# Patient Record
Sex: Female | Born: 1955 | Race: Black or African American | Hispanic: No | Marital: Married | Smoking: Former smoker
Health system: Southern US, Community
[De-identification: ages and names within clinical notes are randomized; demographics above are authoritative.]

## PROBLEM LIST (undated history)

## (undated) DIAGNOSIS — B977 Papillomavirus as the cause of diseases classified elsewhere: Secondary | ICD-10-CM

## (undated) DIAGNOSIS — F329 Major depressive disorder, single episode, unspecified: Secondary | ICD-10-CM

## (undated) DIAGNOSIS — M5136 Other intervertebral disc degeneration, lumbar region: Secondary | ICD-10-CM

## (undated) DIAGNOSIS — I10 Essential (primary) hypertension: Secondary | ICD-10-CM

## (undated) DIAGNOSIS — N952 Postmenopausal atrophic vaginitis: Secondary | ICD-10-CM

## (undated) DIAGNOSIS — M1990 Unspecified osteoarthritis, unspecified site: Secondary | ICD-10-CM

## (undated) DIAGNOSIS — F32A Depression, unspecified: Secondary | ICD-10-CM

## (undated) DIAGNOSIS — M51369 Other intervertebral disc degeneration, lumbar region without mention of lumbar back pain or lower extremity pain: Secondary | ICD-10-CM

## (undated) DIAGNOSIS — N63 Unspecified lump in unspecified breast: Secondary | ICD-10-CM

## (undated) DIAGNOSIS — K5790 Diverticulosis of intestine, part unspecified, without perforation or abscess without bleeding: Secondary | ICD-10-CM

## (undated) HISTORY — DX: Other intervertebral disc degeneration, lumbar region without mention of lumbar back pain or lower extremity pain: M51.369

## (undated) HISTORY — DX: Diverticulosis of intestine, part unspecified, without perforation or abscess without bleeding: K57.90

## (undated) HISTORY — DX: Papillomavirus as the cause of diseases classified elsewhere: B97.7

## (undated) HISTORY — DX: Postmenopausal atrophic vaginitis: N95.2

## (undated) HISTORY — DX: Major depressive disorder, single episode, unspecified: F32.9

## (undated) HISTORY — PX: OTHER SURGICAL HISTORY: SHX169

## (undated) HISTORY — DX: Unspecified osteoarthritis, unspecified site: M19.90

## (undated) HISTORY — DX: Unspecified lump in unspecified breast: N63.0

## (undated) HISTORY — DX: Other intervertebral disc degeneration, lumbar region: M51.36

## (undated) HISTORY — DX: Essential (primary) hypertension: I10

## (undated) HISTORY — DX: Depression, unspecified: F32.A

---

## 2000-08-22 ENCOUNTER — Encounter: Admission: RE | Admit: 2000-08-22 | Discharge: 2000-08-22 | Payer: Self-pay | Admitting: Internal Medicine

## 2006-06-21 ENCOUNTER — Ambulatory Visit: Payer: Self-pay | Admitting: Family Medicine

## 2006-09-05 ENCOUNTER — Ambulatory Visit: Payer: Self-pay | Admitting: Family Medicine

## 2006-09-05 ENCOUNTER — Other Ambulatory Visit: Admission: RE | Admit: 2006-09-05 | Discharge: 2006-09-05 | Payer: Self-pay | Admitting: Family Medicine

## 2006-09-05 ENCOUNTER — Encounter: Payer: Self-pay | Admitting: Family Medicine

## 2006-09-05 LAB — CONVERTED CEMR LAB
ALT: 15 units/L (ref 0–40)
AST: 20 units/L (ref 0–37)
Alkaline Phosphatase: 45 units/L (ref 39–117)
BUN: 11 mg/dL (ref 6–23)
Basophils Relative: 0.6 % (ref 0.0–1.0)
CO2: 27 meq/L (ref 19–32)
Calcium: 9.3 mg/dL (ref 8.4–10.5)
Creatinine, Ser: 0.8 mg/dL (ref 0.4–1.2)
Eosinophils Relative: 1.9 % (ref 0.0–5.0)
HDL: 57.1 mg/dL (ref >39.0)
Hemoglobin: 13 g/dL (ref 12.0–15.0)
LDL Cholesterol: 111 mg/dL — ABNORMAL HIGH (ref 0–99)
Monocytes Relative: 7.2 % (ref 3.0–11.0)
Platelets: 256 10*3/uL (ref 150–400)
RDW: 14.9 % — ABNORMAL HIGH (ref 11.5–14.6)
Total Bilirubin: 0.7 mg/dL (ref 0.3–1.2)
Total Protein: 7 g/dL (ref 6.0–8.3)
Triglycerides: 59 mg/dL (ref 0–149)
VLDL: 12 mg/dL (ref 0–40)
WBC: 3.4 10*3/uL — ABNORMAL LOW (ref 4.5–10.5)

## 2006-09-19 ENCOUNTER — Ambulatory Visit: Payer: Self-pay | Admitting: Gastroenterology

## 2006-09-19 LAB — CONVERTED CEMR LAB: Folate: 9 ng/mL

## 2006-10-23 ENCOUNTER — Ambulatory Visit: Payer: Self-pay | Admitting: Gastroenterology

## 2006-11-15 ENCOUNTER — Ambulatory Visit: Payer: Self-pay | Admitting: Family Medicine

## 2006-11-21 ENCOUNTER — Ambulatory Visit: Payer: Self-pay | Admitting: Gastroenterology

## 2007-02-19 ENCOUNTER — Encounter: Admission: RE | Admit: 2007-02-19 | Discharge: 2007-02-19 | Payer: Self-pay | Admitting: Family Medicine

## 2007-02-27 ENCOUNTER — Telehealth: Payer: Self-pay | Admitting: Family Medicine

## 2007-03-12 DIAGNOSIS — M542 Cervicalgia: Secondary | ICD-10-CM

## 2007-03-13 ENCOUNTER — Ambulatory Visit: Payer: Self-pay | Admitting: Family Medicine

## 2007-03-25 DIAGNOSIS — J309 Allergic rhinitis, unspecified: Secondary | ICD-10-CM | POA: Insufficient documentation

## 2007-07-16 ENCOUNTER — Telehealth: Payer: Self-pay | Admitting: *Deleted

## 2007-10-17 ENCOUNTER — Other Ambulatory Visit: Admission: RE | Admit: 2007-10-17 | Discharge: 2007-10-17 | Payer: Self-pay | Admitting: Family Medicine

## 2007-10-17 ENCOUNTER — Ambulatory Visit: Payer: Self-pay | Admitting: Family Medicine

## 2007-10-17 ENCOUNTER — Encounter: Payer: Self-pay | Admitting: Family Medicine

## 2007-10-17 DIAGNOSIS — N952 Postmenopausal atrophic vaginitis: Secondary | ICD-10-CM

## 2008-04-05 ENCOUNTER — Encounter: Admission: RE | Admit: 2008-04-05 | Discharge: 2008-04-05 | Payer: Self-pay | Admitting: Family Medicine

## 2008-04-06 ENCOUNTER — Telehealth: Payer: Self-pay | Admitting: Family Medicine

## 2008-04-06 ENCOUNTER — Ambulatory Visit: Payer: Self-pay | Admitting: Family Medicine

## 2008-04-06 DIAGNOSIS — M5030 Other cervical disc degeneration, unspecified cervical region: Secondary | ICD-10-CM

## 2008-04-06 DIAGNOSIS — M25559 Pain in unspecified hip: Secondary | ICD-10-CM

## 2008-04-08 ENCOUNTER — Encounter: Admission: RE | Admit: 2008-04-08 | Discharge: 2008-04-08 | Payer: Self-pay | Admitting: Family Medicine

## 2008-04-15 ENCOUNTER — Ambulatory Visit: Payer: Self-pay | Admitting: Family Medicine

## 2008-07-28 ENCOUNTER — Ambulatory Visit: Payer: Self-pay | Admitting: Family Medicine

## 2008-09-27 ENCOUNTER — Encounter: Admission: RE | Admit: 2008-09-27 | Discharge: 2008-09-27 | Payer: Self-pay

## 2009-04-06 ENCOUNTER — Encounter: Admission: RE | Admit: 2009-04-06 | Discharge: 2009-04-06 | Payer: Self-pay | Admitting: Family Medicine

## 2009-04-16 ENCOUNTER — Ambulatory Visit: Payer: Self-pay | Admitting: Internal Medicine

## 2009-04-16 DIAGNOSIS — S335XXA Sprain of ligaments of lumbar spine, initial encounter: Secondary | ICD-10-CM

## 2009-04-20 ENCOUNTER — Ambulatory Visit: Payer: Self-pay | Admitting: Family Medicine

## 2009-04-20 DIAGNOSIS — S239XXA Sprain of unspecified parts of thorax, initial encounter: Secondary | ICD-10-CM

## 2009-04-21 ENCOUNTER — Encounter: Payer: Self-pay | Admitting: Family Medicine

## 2009-04-21 ENCOUNTER — Other Ambulatory Visit: Admission: RE | Admit: 2009-04-21 | Discharge: 2009-04-21 | Payer: Self-pay | Admitting: Family Medicine

## 2009-04-21 ENCOUNTER — Ambulatory Visit: Payer: Self-pay | Admitting: Family Medicine

## 2009-04-21 DIAGNOSIS — N63 Unspecified lump in unspecified breast: Secondary | ICD-10-CM | POA: Insufficient documentation

## 2009-04-21 DIAGNOSIS — N898 Other specified noninflammatory disorders of vagina: Secondary | ICD-10-CM | POA: Insufficient documentation

## 2009-04-21 LAB — CONVERTED CEMR LAB: GC Probe Amp, Genital: NEGATIVE

## 2009-04-25 ENCOUNTER — Encounter (INDEPENDENT_AMBULATORY_CARE_PROVIDER_SITE_OTHER): Payer: Self-pay | Admitting: *Deleted

## 2009-04-25 ENCOUNTER — Ambulatory Visit: Payer: Self-pay | Admitting: Family Medicine

## 2009-04-25 DIAGNOSIS — M25569 Pain in unspecified knee: Secondary | ICD-10-CM | POA: Insufficient documentation

## 2009-04-27 ENCOUNTER — Telehealth: Payer: Self-pay | Admitting: Family Medicine

## 2009-05-17 ENCOUNTER — Telehealth: Payer: Self-pay | Admitting: Family Medicine

## 2009-05-17 ENCOUNTER — Encounter: Payer: Self-pay | Admitting: Family Medicine

## 2009-07-06 ENCOUNTER — Ambulatory Visit: Payer: Self-pay | Admitting: Family Medicine

## 2009-09-27 ENCOUNTER — Ambulatory Visit: Payer: Self-pay | Admitting: Family Medicine

## 2010-04-17 ENCOUNTER — Encounter: Admission: RE | Admit: 2010-04-17 | Discharge: 2010-04-17 | Payer: Self-pay | Admitting: Family Medicine

## 2010-08-17 NOTE — Assessment & Plan Note (Signed)
Summary: breast pain/dm   Vital Signs:  Patient profile:   55 year old female Weight:      169 pounds Temp:     99.1 degrees F oral BP sitting:   118 / 80  (left arm) Cuff size:   regular  Vitals Entered By: Kern Reap CMA Duncan Dull) (September 27, 2009 2:51 PM) CC: breast pain left breast Is Patient Diabetic? No Pain Assessment Patient in pain? yes     Location: left breast Type: sharp Onset of pain  Gradual   CC:  breast pain left breast.  History of Present Illness: Vanessa Barrera is a 55 year old single female, who comes in today for evaluation of soreness in her left breast for two months.  She had a screening mammogram 12 months ago, which was normal, except there was an area.  They were concerned about.  She therefore, back for follow-up 6 months ago.  She was told that was normal.  Two months ago she began soreness in her left breast.  She describes as sharp and constant and she points to the 6 o'clock area as the source of her pain.  She said a history of fibrocystic changes.  LMP was over a year ago.  She consumes modest amounts of caffeine  Allergies: No Known Drug Allergies  Past History:  Past medical, surgical, family and social histories (including risk factors) reviewed for relevance to current acute and chronic problems.  Past Medical History: Reviewed history from 10/17/2007 and no changes required. Allergic rhinitis breast biopsies x 3 fractured left ankle fractured left pelvic bone  torn ligament left knee  Past Surgical History: Reviewed history from 03/25/2007 and no changes required. Breast Biopsy x3 L ankle surgery CB x2  Family History: Reviewed history from 03/25/2007 and no changes required. Family History of AIDS Family History of Alcoholism/Addiction Family History Breast cancer 1st degree relative <50 Family History of CAD Female 1st degree relative <50 Family History Diabetes 1st degree relative Family History Hypertension Family History of  Stroke M 1st degree relative <50  Social History: Reviewed history from 03/25/2007 and no changes required. Occupation: Married Never Smoked Alcohol use-yes Drug use-no  Review of Systems      See HPI  Physical Exam  General:  Well-developed,well-nourished,in no acute distress; alert,appropriate and cooperative throughout examination Breasts:  the left breast is normal except for some tenderness at the 6 o'clock position with 3 or 4 small, very tender fibrocystic changes.  Right breast normal except for some nontender fibrocystic changes under the right nipple.  Skin normal.  No discharge no dimpling.all lesions are soft, rubbery, and movable   Impression & Recommendations:  Problem # 1:  BREAST MASS, LEFT (ICD-611.72) Assessment Deteriorated  Complete Medication List: 1)  Carisoprodol 350 Mg Tabs (Carisoprodol) .... 1/2-1 tab by mouth three times a day as needed for muscle spasm 2)  Metronidazole 500 Mg Tabs (Metronidazole) .... Take 1 tablet by mouth two times a day  Patient Instructions: 1)  go on a complete caffeine free diet if after 3 weeks.  The left breast continues to be sore.  Let us know

## 2010-10-18 ENCOUNTER — Other Ambulatory Visit (INDEPENDENT_AMBULATORY_CARE_PROVIDER_SITE_OTHER): Payer: BC Managed Care – PPO

## 2010-10-18 DIAGNOSIS — Z0000 Encounter for general adult medical examination without abnormal findings: Secondary | ICD-10-CM

## 2010-10-18 LAB — CBC WITH DIFFERENTIAL/PLATELET
Eosinophils Relative: 4 % (ref 0.0–5.0)
HCT: 39 % (ref 36.0–46.0)
Hemoglobin: 13.1 g/dL (ref 12.0–15.0)
Lymphs Abs: 1.9 10*3/uL (ref 0.7–4.0)
Monocytes Relative: 7.4 % (ref 3.0–12.0)
Platelets: 246 10*3/uL (ref 150.0–400.0)
RBC: 4.66 Mil/uL (ref 3.87–5.11)
WBC: 4.1 10*3/uL — ABNORMAL LOW (ref 4.5–10.5)

## 2010-10-18 LAB — URINALYSIS, ROUTINE W REFLEX MICROSCOPIC
Leukocytes, UA: NEGATIVE
Nitrite: NEGATIVE
Specific Gravity, Urine: >=1.03 (ref 1.000–1.030)
pH: 5.5 (ref 5.0–8.0)

## 2010-10-18 LAB — BASIC METABOLIC PANEL
CO2: 28 mEq/L (ref 19–32)
Calcium: 9.4 mg/dL (ref 8.4–10.5)
Glucose, Bld: 80 mg/dL (ref 70–99)
Sodium: 144 mEq/L (ref 135–145)

## 2010-10-18 LAB — HEPATIC FUNCTION PANEL
AST: 24 U/L (ref 0–37)
Total Bilirubin: 0.7 mg/dL (ref 0.3–1.2)

## 2010-10-18 LAB — LIPID PANEL
HDL: 47.5 mg/dL (ref >39.00)
Total CHOL/HDL Ratio: 4
VLDL: 8.4 mg/dL (ref 0.0–40.0)

## 2010-10-18 LAB — TSH: TSH: 1.02 u[IU]/mL (ref 0.35–5.50)

## 2010-10-30 ENCOUNTER — Encounter: Payer: Self-pay | Admitting: Family Medicine

## 2010-10-31 ENCOUNTER — Encounter: Payer: Self-pay | Admitting: Family Medicine

## 2010-10-31 ENCOUNTER — Other Ambulatory Visit (HOSPITAL_COMMUNITY)
Admission: RE | Admit: 2010-10-31 | Discharge: 2010-10-31 | Disposition: A | Discharge status: Home / Self Care | Payer: BLUE CROSS/BLUE SHIELD | Source: Ambulatory Visit | Attending: Family Medicine | Admitting: Family Medicine

## 2010-10-31 ENCOUNTER — Ambulatory Visit (INDEPENDENT_AMBULATORY_CARE_PROVIDER_SITE_OTHER): Payer: BC Managed Care – PPO | Admitting: Family Medicine

## 2010-10-31 DIAGNOSIS — Z01419 Encounter for gynecological examination (general) (routine) without abnormal findings: Secondary | ICD-10-CM | POA: Insufficient documentation

## 2010-10-31 DIAGNOSIS — Z0000 Encounter for general adult medical examination without abnormal findings: Secondary | ICD-10-CM

## 2010-10-31 DIAGNOSIS — Z23 Encounter for immunization: Secondary | ICD-10-CM

## 2010-10-31 DIAGNOSIS — N952 Postmenopausal atrophic vaginitis: Secondary | ICD-10-CM

## 2010-10-31 MED ORDER — ESTROGENS, CONJUGATED 0.625 MG/GM VA CREA
TOPICAL_CREAM | VAGINAL | Status: DC
Start: 2010-10-31 — End: 2012-03-31

## 2010-10-31 NOTE — Progress Notes (Signed)
  Subjective:    Patient ID: Vanessa Barrera, female    DOB: 15-Jan-1956, 55 y.o.   MRN: [id number]  HPIjoanie Is a 55 year old, married female, nonsmoker, who comes in today for general physical examination and to discuss two.  New problems.  She enjoys been in excellent, health.  She's had no chronic health problems.  A year ago.  She fell at work and began having right hip pain.  She's been to see the orthopedist to no avail.  She continues to have pain.  There not sure of the cause.  They have her set up for a second opinion on June the 22nd with another orthopedist in the same group.  LMP was 2008 since that, time.  She's had trouble with vaginal dryness.  Not using any lubrication.  Not sure of tetanus booster will give booster today.  Due colonoscopy.  Mammogram October 2000 with a normal.  History of fibrocystic disease and biopsies x 4    Review of Systems  Constitutional: Negative.   HENT: Negative.   Eyes: Negative.   Respiratory: Negative.   Cardiovascular: Negative.   Gastrointestinal: Negative.   Genitourinary: Negative.   Musculoskeletal: Negative.   Neurological: Negative.   Hematological: Negative.   Psychiatric/Behavioral: Negative.        Objective:   Physical Exam  Constitutional: She appears well-developed and well-nourished.  HENT:  Head: Normocephalic and atraumatic.  Right Ear: External ear normal.  Left Ear: External ear normal.  Nose: Nose normal.  Mouth/Throat: Oropharynx is clear and moist.  Eyes: EOM are normal. Pupils are equal, round, and reactive to light.  Neck: Normal range of motion. Neck supple. No thyromegaly present.  Cardiovascular: Normal rate, regular rhythm, normal heart sounds and intact distal pulses.  Exam reveals no gallop and no friction rub.   No murmur heard. Pulmonary/Chest: Effort normal and breath sounds normal.  Abdominal: Soft. Bowel sounds are normal. She exhibits no distension and no mass. There is no tenderness.  There is no rebound.  Genitourinary: Vagina normal and uterus normal. Guaiac negative stool. No vaginal discharge found.       Bilateral breast exam shows for scars from previous biopsies.  She has diffuse fibrocystic changes.  No dominant mass  Extremely dry.  Vagina  Musculoskeletal: Normal range of motion.  Lymphadenopathy:    She has no cervical adenopathy.  Neurological: She is alert. She has normal reflexes. No cranial nerve deficit. She exhibits normal muscle tone. Coordination normal.  Skin: Skin is warm and dry.  Psychiatric: She has a normal mood and affect. Her behavior is normal. Judgment and thought content normal.          Assessment & Plan:  Healthy female.  Postmenopausal vaginal dryness Perman, vaginal cream daily x 3 weeks, then twice a week.  Right hip pain, unknown etiology, x 1 year secondary to fall at work.  Continue follow-up by orthopedist Motrin, 600 t.i.d. With food.  We will also get you set up for colonoscopy and give you a tetanus booster today

## 2010-10-31 NOTE — Patient Instructions (Signed)
Apply small amounts of the Premarin vaginal cream twice weekly after you've used it nightly for about 3 weeks.  Motrin 600 mg 3 times a day with food for your back and hip pain.  Follow-up with orthopedics as outlined.  We reviewed to set up for a screening colonoscopy.  Continue to do good.  Breast exam monthly and get annual mammogram.  Follow-up with [place] in one year or sooner if any problems

## 2010-11-07 ENCOUNTER — Other Ambulatory Visit: Payer: Self-pay

## 2010-11-14 ENCOUNTER — Ambulatory Visit: Payer: Self-pay | Admitting: Family Medicine

## 2010-11-14 ENCOUNTER — Telehealth: Payer: Self-pay | Admitting: *Deleted
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
## 2010-11-14 NOTE — Telephone Encounter (Signed)
She needs to go to a local urgent care for evaluation.  We cannot prescribe medicine over the phone

## 2010-11-14 NOTE — Telephone Encounter (Addendum)
Pt is in [place], [employer], and is flying out to [place] tomorrow.  She has an ear ache and wants Dr. Tawanna Cooler to call in RX to a [facility] there  in [place].

## 2010-11-14 NOTE — Telephone Encounter (Signed)
Spoke to patient

## 2010-12-01 NOTE — Assessment & Plan Note (Signed)
[facility]                         GASTROENTEROLOGY OFFICE NOTE   Vanessa Barrera, Vanessa Barrera                      MRN:          [id number]  DATE:09/19/2006                            DOB:          1956-02-27    Vanessa Barrera is a middle-aged black female referred through the courtesy  of Dr. Tawanna Cooler for evaluation of left lower quadrant pain which has been  present for several years.   Vanessa Barrera has had left lower quadrant pain which was evaluated in  [place], [place] and was felt secondary to a left ovarian cyst.  Her bowel movements were regular until the last 3-4 months when she has  had diarrhea with lower abdominal cramping, gas, and bloating.  She has  no lactose intolerance but continues to use lactose.  She also has found  that roughage and peanuts seems to make her pain worse.  She also has  been a fair amount of sorbitol and fructose in her diet.  She has  minimal upper GI complaints and denies hepatobiliary complaints but has  lost 10-15 pounds over the past year.  This has been involuntary loss.  She denies reflux symptoms, dysphagia, melena, or hematochezia.   She has been seen by Dr. Tawanna Cooler and had an excellent physical exam on  September 05, 2006.  At that time her blood work showed a normal CBC,  metabolic profile, lipid profile, and thyroid function tests.   PAST MEDICAL HISTORY:  1. Hypertension.  2. Previous depression but she is not on medication at this time.   MEDICATIONS:  None.   ALLERGIES:  NONE.   FAMILY HISTORY:  Remarkable for some unknown type of liver disease in  her father, probably related to his alcoholism.  Mother had breast  cancer and diabetes.   SOCIAL HISTORY:  The patient is married and has 2 children.  She does  not smoke or abuse ethanol.   REVIEW OF SYSTEMS:  Noncontributory, with her last menstrual period on  August 29, 2006.  Denies current cardiovascular, pulmonary,  genitourinary, neurological  problems.   EXAMINATION:  She is a healthy appearing back female appearing her  stated age in no acute distress.  She is 5 foot 3 and weighs 151 pounds,  blood pressure 110/74, pulse was 80 and regular.  I could not appreciate  stigmata of chronic liver disease.  CHEST:  Clear and there were no murmurs, gallops, or rubs.  She does  appear to be in a regular rhythm.  I could not appreciate hepatosplenomegaly, abdominal masses, or  significant tenderness.  Bowel sounds were normal.  PERIPHERAL EXTREMITIES:  Unremarkable.  MENTAL STATUS:  Clear.  RECTAL:  Deferred.   ASSESSMENT:  1. Left lower quadrant pain and probable diverticulosis, irritable      bowel syndrome.  2. Lactose intolerance with probable malabsorption of sorbitol and      fructose additionally.  3. Anorexia and weight loss of unexplained etiology.  4. Vague history of previous severe depression.  5. Well controlled essential hypertension by history.   RECOMMENDATIONS:  1. High fiber diet as tolerated with liberal p.o. fluids  and fiber      supplements.  2. Discontinue sorbitol and fructose and giving her a lactose free      diet.  3. Outpatient colonoscopy exam at her convenience.     [name], MD, Caleen Essex, FAGA  Electronically Signed    DRP/MedQ  DD: 09/19/2006  DT: 09/19/2006  Job #: [id number]   cc:   Tinnie Gens A. Tawanna Cooler, MD

## 2010-12-01 NOTE — Assessment & Plan Note (Signed)
[facility]                         GASTROENTEROLOGY OFFICE NOTE   [name], [name]                      MRN:          [id number]  DATE:09/19/2006                            DOB:          [birth year]    [name] is a very pleasant.   INCOMPLETE DICTATION     [name], MD, Caleen Essex, FAGA     DRP/MedQ  DD: 09/19/2006  DT: 09/19/2006  Job #: [id number]   cc:   Tinnie Gens A. Tawanna Cooler, MD

## 2010-12-01 NOTE — Assessment & Plan Note (Signed)
[facility] OFFICE NOTE   ZHAVIA, CUNANAN                      MRN:          [id number]  DATE:09/05/2006                            DOB:          1955-12-10    Ms. Vanessa Barrera is a 55 year old female who comes in today for complete  physical evaluation because of abdominal pain for 7 years.   PAST MEDICAL HISTORY:  She was hospitalized for a surgery of her ankle,  breast biopsies x3 on the left, 1 breast biopsy on the right, torn  ligament in her knee, broken ankle as noted above, also had a fractured  pelvis from a traumatic injury.   PAST ILLNESSES:  None.   DRUG ALLERGIES:  NO KNOWN DRUG ALLERGIES.   Does not smoke or drink any alcohol.   CURRENT MEDICATIONS:  None.   Last CPX was June of 2007 in [place], [place].   REVIEW OF SYSTEMS:  HEAD, EYES, EARS, NOSE, AND THROAT:  Negative.  She  gets regular dental care.  CARDIOPULMONARY:  Negative.  GI:  Pertinent.  She has left lower quadrant abdominal pain for 7-8 years.  She describes  it as sharp, sometimes dull, sometimes it is constant, sometimes it  comes and goes, sometimes she will have loose bowel movements, but she  has had no fever, chills, nausea, vomiting, or diarrhea.  She has had  numerous workups in [place].  At one time she was told she had an ovarian  cyst that was causing her problems.  She currently is perimenopausal.  Her last menstrual period was 2 weeks ago.  She had one 2 weeks prior to  that.  Prior to that, she had not had a period for 2 months.  She is a  gravida 2, para 74, AB 75, 55 year old and 55 year old.  For birth  control, her husband has had a vasectomy.  She does not do BSE on a  regular basis, gets annual mammograms because of recurrent cystic  lesions.  She has had 4 biopsies as noted above.  Unknown negative  weight study.  MUSCULOSKELETAL:  Negative.  VASCULAR:  Negative.  ALLERGIES:  Negative.  The rest of the  review of systems is negative.   SOCIAL HISTORY:  Works at [employer].  She is a Theme park manager.  She is married, moved here from [place], [place].   FAMILY HISTORY:  Dad 23, hypertension, CVA, CAD, and alcoholism.  Mother  47, diabetes type 1, hypertension, breast cancer.  Three brothers, 1  died of AIDS, the other 2 in good health.  Three sisters in good health.   VACCINATION HISTORY:  She has got her records at home, she is not sure  what they are, she will check them and see when her last tetanus history  was.   PHYSICAL EVALUATION:  Height 5 feet 3 inches, weight 156, temp 98.7,  pulse 70 and regular, respirations 12 and regular, BP 120/82.  IN GENERAL:  She is a well-developed, well-nourished female in no acute  distress.  EXAMINATION OF THE HEAD, EYES, EARS, NOSE, AND THROAT:  Negative.  NECK:  Supple.  Thyroid is not enlarged.  CHEST:  Clear to auscultation.  CARDIAC EXAM:  Negative.  BREAST EXAM:  Shows scars x3 on the left, x1 on the right from previous  biopsies.  There was a small cystic lesion at 6 o'clock on her left  breast, about half an inch below the nipple, at the 6 o'clock position.  It is soft, it is about the size of a marble, it is rubbery, and it is  movable.  She says she has had this for a long time.  It has not  changed.  ABDOMINAL EXAM:  Negative, except she has diffuse tenderness and no  rebound.  PELVIC EXAMINATION:  External genitalia within normal limits, vaginal  vault was normal.  Cervix was visualized, Pap smear was done.  Bimanual  exam was negative.  RECTUM:  Normal.  Stool guaiac negative.  EXTREMITIES:  Normal skin, normal peripheral pulses.   LABORATORY DATA:  Will draw her blood work today.   IMPRESSION:  Abdominal pain for 8 years, unknown etiology.   PLAN:  1. Gastrointestinal consult.  2. History of breast cysts x4.  Diagnostic mammogram as soon as      possible.  3. Status post fractured left ankle.  4. Status  post fractured pelvis.  5. Status post torn ligament, left knee, surgically repaired.  6. Status post childbirth x2.   Will begin a diagnostic workup for abdominal pain.  Will get lab work  today.  We will then get her set up for gastrointestinal consult as  noted above.  We also discussed the issue of perimenopausal symptoms,  currently she would like no hormone replacement therapy.  We offered  this as an option.  She will consider it if her periods get worse and  should become more frequent.     Jeffrey A. Tawanna Cooler, MD  Electronically Signed    JAT/MedQ  DD: 09/05/2006  DT: 09/05/2006  Job #: [id number]

## 2011-01-03 ENCOUNTER — Other Ambulatory Visit: Payer: BLUE CROSS/BLUE SHIELD | Admitting: Gastroenterology

## 2011-01-08 ENCOUNTER — Encounter: Payer: Self-pay | Admitting: *Deleted

## 2011-02-02 ENCOUNTER — Encounter: Payer: Self-pay | Admitting: Internal Medicine

## 2011-02-02 ENCOUNTER — Ambulatory Visit (INDEPENDENT_AMBULATORY_CARE_PROVIDER_SITE_OTHER): Payer: BLUE CROSS/BLUE SHIELD | Admitting: Internal Medicine

## 2011-02-02 VITALS — BP 118/76 | Temp 98.0°F | Wt 164.0 lb

## 2011-02-02 DIAGNOSIS — N644 Mastodynia: Secondary | ICD-10-CM

## 2011-02-02 NOTE — Progress Notes (Signed)
  Subjective:    Patient ID: Vanessa Barrera, female    DOB: 06/25/1956, 55 y.o.   MRN: [id number]  HPI 55 year old patient who has a family history of breast cancer. Her mother has breast cancer as well as an aunt. Yesterday she had the onset of sharp shooting left mainly medial breast discomfort. The breast has been slightly tender. She did have a unremarkable mammogram in October of last year. She does recall some strenuous activities yesterday and the day prior    Review of Systems  Constitutional: Negative.   HENT: Negative for hearing loss, congestion, sore throat, rhinorrhea, dental problem, sinus pressure and tinnitus.   Eyes: Negative for pain, discharge and visual disturbance.  Respiratory: Negative for cough and shortness of breath.   Cardiovascular: Negative for chest pain, palpitations and leg swelling.  Gastrointestinal: Negative for nausea, vomiting, abdominal pain, diarrhea, constipation, blood in stool and abdominal distention.  Genitourinary: Negative for dysuria, urgency, frequency, hematuria, flank pain, vaginal bleeding, vaginal discharge, difficulty urinating, vaginal pain and pelvic pain.  Musculoskeletal: Negative for joint swelling, arthralgias and gait problem.  Skin: Negative for rash.  Neurological: Negative for dizziness, syncope, speech difficulty, weakness, numbness and headaches.  Hematological: Negative for adenopathy.  Psychiatric/Behavioral: Negative for behavioral problems, dysphoric mood and agitation. The patient is not nervous/anxious.        Objective:   Physical Exam  Constitutional: She appears well-developed and well-nourished. No distress.  Pulmonary/Chest:       The left breast and axilla examined and unremarkable. No tenderness or mass effect          Assessment & Plan:   Left breast pain. The patient treated with an anti-inflammatory and clinically observed;  if this fails to improve promptly she will recontact the office

## 2011-02-02 NOTE — Patient Instructions (Signed)
Call or return to clinic prn if these symptoms worsen or fail to improve as anticipated.

## 2011-02-27 ENCOUNTER — Encounter: Payer: Self-pay | Admitting: Gastroenterology

## 2011-03-09 ENCOUNTER — Ambulatory Visit (AMBULATORY_SURGERY_CENTER): Payer: BLUE CROSS/BLUE SHIELD | Admitting: *Deleted

## 2011-03-09 ENCOUNTER — Encounter: Payer: Self-pay | Admitting: Gastroenterology

## 2011-03-09 VITALS — Ht 63.0 in | Wt 159.8 lb

## 2011-03-09 DIAGNOSIS — R1032 Left lower quadrant pain: Secondary | ICD-10-CM

## 2011-03-09 MED ORDER — PEG-KCL-NACL-NASULF-NA ASC-C 100 G PO SOLR: ORAL | Status: DC

## 2011-03-21 ENCOUNTER — Other Ambulatory Visit: Payer: BLUE CROSS/BLUE SHIELD | Admitting: Gastroenterology

## 2011-03-29 ENCOUNTER — Other Ambulatory Visit: Payer: Self-pay | Admitting: Family Medicine

## 2011-04-11 ENCOUNTER — Encounter: Payer: Self-pay | Admitting: Family Medicine

## 2011-04-11 ENCOUNTER — Ambulatory Visit (INDEPENDENT_AMBULATORY_CARE_PROVIDER_SITE_OTHER): Payer: BC Managed Care – PPO | Admitting: Family Medicine

## 2011-04-11 DIAGNOSIS — N6039 Fibrosclerosis of unspecified breast: Secondary | ICD-10-CM

## 2011-04-11 DIAGNOSIS — N63 Unspecified lump in unspecified breast: Secondary | ICD-10-CM

## 2011-04-11 NOTE — Progress Notes (Signed)
  Subjective:    Patient ID: Vanessa Barrera, female    DOB: [birth year], 55 y.o.   MRN: [id number]  HPI J.  is a 55 year old, married female, nonsmoker, who comes in today because when she called begin her mammogram.  They refused to sit up because she said she had a breast lump,  She's had a history of fibrocystic disease in the past.  There been no changes.  LMP 5 years ago.   Review of Systems General and review of systems negative   Objective:   Physical Exam  Well-developed well-nourished, female in no acute distress.  Examination of both breasts shows them to be symmetrical, no skin changes, no rash around the areola.  No nipple discharge, palpable fibrocystic changes throughout both breasts, unchanged from previous exam      Assessment & Plan:  Fibrocystic breast plan screening mammogram

## 2011-04-11 NOTE — Patient Instructions (Signed)
Called the breast Center and get set up for your screening mammogram.  Tell them you have a long-standing history of fibrocystic changes.  No new lesions

## 2011-04-17 ENCOUNTER — Ambulatory Visit (INDEPENDENT_AMBULATORY_CARE_PROVIDER_SITE_OTHER): Payer: BC Managed Care – PPO | Admitting: Family Medicine

## 2011-04-17 ENCOUNTER — Ambulatory Visit (INDEPENDENT_AMBULATORY_CARE_PROVIDER_SITE_OTHER)
Admission: RE | Admit: 2011-04-17 | Discharge: 2011-04-17 | Disposition: A | Discharge status: Home / Self Care | Payer: BC Managed Care – PPO | Source: Ambulatory Visit | Attending: Family Medicine | Admitting: Family Medicine

## 2011-04-17 ENCOUNTER — Encounter: Payer: Self-pay | Admitting: Family Medicine

## 2011-04-17 VITALS — BP 120/80 | Temp 98.5°F | Wt 162.0 lb

## 2011-04-17 DIAGNOSIS — M79609 Pain in unspecified limb: Secondary | ICD-10-CM

## 2011-04-17 DIAGNOSIS — M79642 Pain in left hand: Secondary | ICD-10-CM

## 2011-04-17 NOTE — Patient Instructions (Addendum)
Go to the main office now for an x-ray of your hand.  Have them call me the report [phone],.............Marland Kitchen before, you leave the office  Your x-ray shows no evidence of fracture.  Advised to splint, elevation, ice, Motrin.  Return p.r.n.

## 2011-04-17 NOTE — Progress Notes (Signed)
  Subjective:    Patient ID: Vanessa Barrera, female    DOB: 09-14-1955, 55 y.o.   MRN: [id number]  HPI Vanessa Barrera Is a 55 year old female, who comes in today for evaluation of pain in her left hand.  She states on Saturday she fell at home and hit her elbow and right hand.  She states her elbows okay, but the pain in her right hand is getting worse.  She points to the mid shaft of her fifth metacarpal as a source of her discomfort.   Review of Systems    General an orthopedic review of systems otherwise negative Objective:   Physical Exam Well-developed well-nourished, female, no acute distress.  Examination of the shoulder and elbow are normal.  She is able to move her wrist without difficulty.  All the fingers are normal except for pain midshaft left fifth metacarpal       Assessment & Plan:  Pain left hand question fracture sent for x-ray now

## 2011-04-18 ENCOUNTER — Telehealth: Payer: Self-pay | Admitting: Family Medicine

## 2011-04-18 NOTE — Telephone Encounter (Signed)
Pt needs hand xray results

## 2011-09-13 ENCOUNTER — Ambulatory Visit (INDEPENDENT_AMBULATORY_CARE_PROVIDER_SITE_OTHER): Payer: BC Managed Care – PPO | Admitting: Family Medicine

## 2011-09-13 ENCOUNTER — Encounter: Payer: Self-pay | Admitting: Family Medicine

## 2011-09-13 ENCOUNTER — Ambulatory Visit: Payer: BC Managed Care – PPO | Admitting: Family Medicine

## 2011-09-13 DIAGNOSIS — B977 Papillomavirus as the cause of diseases classified elsewhere: Secondary | ICD-10-CM | POA: Insufficient documentation

## 2011-09-13 DIAGNOSIS — L509 Urticaria, unspecified: Secondary | ICD-10-CM | POA: Insufficient documentation

## 2011-09-13 MED ORDER — PREDNISONE 20 MG PO TABS: ORAL_TABLET | ORAL | Status: DC

## 2011-09-13 MED ORDER — ACYCLOVIR 400 MG PO TABS: ORAL_TABLET | ORAL | Status: DC

## 2011-09-13 NOTE — Patient Instructions (Signed)
First try plain 10 mg of Zyrtec nightly to see if the urticaria will not resolve.  If it doesn't take a short course of prednisone as outlined  Acyclovir 400 mg daily  Call [facility] orthopedics and asked to see Dr. Toni Arthurs orthopedics with specialist

## 2011-09-13 NOTE — Progress Notes (Signed)
  Subjective:    Patient ID: Vanessa Barrera, female    DOB: 10/22/1955, 56 y.o.   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 She would like to discuss options.  For the past couple weeks she's had intermittent episodes of urticaria. It started on her chest and it goes to her arms or legs. These were red whelps a ditch and then resolve spontaneously. She has had a history of allergic rhinitis but no urticaria in the past. Dietary review of system is negative  She's had surgery on her left ankle twice and now is unstable we'll refer to Dr. Toni Arthurs   Review of Systems    general and dermatologic musculoskeletal GYN review of systems otherwise negative Objective:   Physical Exam Well-developed well-nourished female in no acute distress examination skin shows 3 lesions right arm one on the left bread raised and pruritic hives       Assessment & Plan:  Urticaria etiology unknown plan start with plain and 10 mg of Zyrtec each bedtime if lesions do not resolve then short course of oral prednisone  Recurrent H S.v,,,,,,, Zovirax 400 mg daily

## 2011-10-24 ENCOUNTER — Other Ambulatory Visit: Payer: BC Managed Care – PPO

## 2011-10-24 ENCOUNTER — Encounter: Payer: BC Managed Care – PPO | Admitting: Family Medicine

## 2011-10-31 ENCOUNTER — Other Ambulatory Visit: Payer: Self-pay | Admitting: Family Medicine

## 2011-10-31 DIAGNOSIS — Z1231 Encounter for screening mammogram for malignant neoplasm of breast: Secondary | ICD-10-CM

## 2011-11-06 ENCOUNTER — Encounter: Payer: BC Managed Care – PPO | Admitting: Family Medicine

## 2011-11-08 ENCOUNTER — Ambulatory Visit
Admission: RE | Admit: 2011-11-08 | Discharge: 2011-11-08 | Disposition: A | Discharge status: Home / Self Care | Payer: BC Managed Care – PPO | Source: Ambulatory Visit | Attending: Family Medicine | Admitting: Family Medicine

## 2011-11-08 DIAGNOSIS — Z1231 Encounter for screening mammogram for malignant neoplasm of breast: Secondary | ICD-10-CM

## 2011-12-25 ENCOUNTER — Other Ambulatory Visit: Payer: BC Managed Care – PPO

## 2012-01-01 ENCOUNTER — Encounter: Payer: BC Managed Care – PPO | Admitting: Family Medicine

## 2012-03-24 ENCOUNTER — Other Ambulatory Visit: Payer: BC Managed Care – PPO

## 2012-03-25 ENCOUNTER — Telehealth: Payer: Self-pay | Admitting: Family Medicine

## 2012-03-25 ENCOUNTER — Other Ambulatory Visit (INDEPENDENT_AMBULATORY_CARE_PROVIDER_SITE_OTHER): Payer: BC Managed Care – PPO

## 2012-03-25 ENCOUNTER — Other Ambulatory Visit: Payer: Self-pay | Admitting: Family Medicine

## 2012-03-25 DIAGNOSIS — Z0000 Encounter for general adult medical examination without abnormal findings: Secondary | ICD-10-CM

## 2012-03-25 LAB — CBC WITH DIFFERENTIAL/PLATELET
Basophils Absolute: 0 10*3/uL (ref 0.0–0.1)
Eosinophils Absolute: 0.1 10*3/uL (ref 0.0–0.7)
Hemoglobin: 12.4 g/dL (ref 12.0–15.0)
Lymphocytes Relative: 46 % (ref 12.0–46.0)
MCHC: 32.3 g/dL (ref 30.0–36.0)
MCV: 85.2 fl (ref 78.0–100.0)
Monocytes Absolute: 0.3 10*3/uL (ref 0.1–1.0)
Neutro Abs: 2 10*3/uL (ref 1.4–7.7)
Neutrophils Relative %: 44.1 % (ref 43.0–77.0)
RDW: 15.6 % — ABNORMAL HIGH (ref 11.5–14.6)

## 2012-03-25 LAB — POCT URINALYSIS DIPSTICK
Glucose, UA: NEGATIVE
Leukocytes, UA: NEGATIVE
Nitrite, UA: NEGATIVE
Protein, UA: NEGATIVE
Spec Grav, UA: >=1.03
Urobilinogen, UA: 0.2

## 2012-03-25 LAB — LIPID PANEL
Cholesterol: 186 mg/dL (ref 0–200)
LDL Cholesterol: 108 mg/dL — ABNORMAL HIGH (ref 0–99)
Triglycerides: 53 mg/dL (ref 0.0–149.0)

## 2012-03-25 LAB — HEPATIC FUNCTION PANEL
Albumin: 4.4 g/dL (ref 3.5–5.2)
Alkaline Phosphatase: 58 U/L (ref 39–117)

## 2012-03-25 LAB — BASIC METABOLIC PANEL
CO2: 25 mEq/L (ref 19–32)
Calcium: 9.4 mg/dL (ref 8.4–10.5)
Creatinine, Ser: 0.9 mg/dL (ref 0.4–1.2)
Glucose, Bld: 83 mg/dL (ref 70–99)

## 2012-03-25 NOTE — Telephone Encounter (Signed)
Patient came to the lab today and lab order placed

## 2012-03-25 NOTE — Telephone Encounter (Signed)
Vanessa Barrera please call and find out the details this is obviously a false positive. If she would like to come in here so we can repeat the tests we'll be happy to do that

## 2012-03-25 NOTE — Telephone Encounter (Signed)
Pt received a drug test at a different location for work and her result came back that she has pot in her system. Pt states that she hasnt smoked in 30 years and would like to know the reason this could happen

## 2012-03-31 ENCOUNTER — Other Ambulatory Visit (HOSPITAL_COMMUNITY)
Admission: RE | Admit: 2012-03-31 | Discharge: 2012-03-31 | Disposition: A | Discharge status: Home / Self Care | Payer: BC Managed Care – PPO | Source: Ambulatory Visit | Attending: Family Medicine | Admitting: Family Medicine

## 2012-03-31 ENCOUNTER — Encounter: Payer: Self-pay | Admitting: Family Medicine

## 2012-03-31 ENCOUNTER — Ambulatory Visit (INDEPENDENT_AMBULATORY_CARE_PROVIDER_SITE_OTHER): Payer: BC Managed Care – PPO | Admitting: Family Medicine

## 2012-03-31 VITALS — BP 110/70 | Temp 98.6°F | Ht 64.25 in | Wt 162.0 lb

## 2012-03-31 DIAGNOSIS — N952 Postmenopausal atrophic vaginitis: Secondary | ICD-10-CM

## 2012-03-31 DIAGNOSIS — Z01419 Encounter for gynecological examination (general) (routine) without abnormal findings: Secondary | ICD-10-CM | POA: Insufficient documentation

## 2012-03-31 DIAGNOSIS — Z23 Encounter for immunization: Secondary | ICD-10-CM

## 2012-03-31 DIAGNOSIS — Z1151 Encounter for screening for human papillomavirus (HPV): Secondary | ICD-10-CM | POA: Insufficient documentation

## 2012-03-31 DIAGNOSIS — N63 Unspecified lump in unspecified breast: Secondary | ICD-10-CM

## 2012-03-31 DIAGNOSIS — B977 Papillomavirus as the cause of diseases classified elsewhere: Secondary | ICD-10-CM

## 2012-03-31 DIAGNOSIS — Z0000 Encounter for general adult medical examination without abnormal findings: Secondary | ICD-10-CM

## 2012-03-31 DIAGNOSIS — R319 Hematuria, unspecified: Secondary | ICD-10-CM

## 2012-03-31 DIAGNOSIS — J309 Allergic rhinitis, unspecified: Secondary | ICD-10-CM

## 2012-03-31 LAB — POCT URINALYSIS DIPSTICK
Glucose, UA: NEGATIVE
Ketones, UA: NEGATIVE
Leukocytes, UA: NEGATIVE
Protein, UA: NEGATIVE
Spec Grav, UA: >=1.03

## 2012-03-31 MED ORDER — ACYCLOVIR 400 MG PO TABS: ORAL_TABLET | ORAL | Status: DC

## 2012-03-31 MED ORDER — ESTROGENS, CONJUGATED 0.625 MG/GM VA CREA: TOPICAL_CREAM | VAGINAL | Status: AC

## 2012-03-31 NOTE — Patient Instructions (Addendum)
Is small amounts of the hormonal cream twice weekly  Zovirax 400 mg 2 tabs twice daily when necessary  Do a thorough breast exam monthly  Return in one year sooner if any problem

## 2012-03-31 NOTE — Progress Notes (Signed)
  Subjective:    Patient ID: Vanessa Barrera, female    DOB: November 04, 1955, 56 y.o.   MRN: [id number]  HPI Vanessa Barrera is a 56 year old female nonsmoker who comes in today for general physical examination  She has a history of occasional HPV for which she takes Zovirax when necessary  She has a history of fibrocystic breast changes does not do a breast exam monthly but does get an annual mammogram.  She gets routine eye care, dental care, colonoscopy when she turned 50 normal, tetanus 2012, seasonal flu shot today.   Review of Systems  Constitutional: Negative.   HENT: Negative.   Eyes: Negative.   Respiratory: Negative.   Cardiovascular: Negative.   Gastrointestinal: Negative.   Genitourinary: Negative.   Musculoskeletal: Negative.   Neurological: Negative.   Hematological: Negative.   Psychiatric/Behavioral: Negative.        Objective:   Physical Exam  Constitutional: She appears well-developed and well-nourished.  HENT:  Head: Normocephalic and atraumatic.  Right Ear: External ear normal.  Left Ear: External ear normal.  Nose: Nose normal.  Mouth/Throat: Oropharynx is clear and moist.  Eyes: EOM are normal. Pupils are equal, round, and reactive to light.  Neck: Normal range of motion. Neck supple. No thyromegaly present.  Cardiovascular: Normal rate, regular rhythm, normal heart sounds and intact distal pulses.  Exam reveals no gallop and no friction rub.   No murmur heard. Pulmonary/Chest: Effort normal and breath sounds normal.  Abdominal: Soft. Bowel sounds are normal. She exhibits no distension and no mass. There is no tenderness. There is no rebound.  Genitourinary: Vagina normal and uterus normal. Guaiac negative stool. No vaginal discharge found.       Bilateral breast exam normal vaginal dryness  Musculoskeletal: Normal range of motion.  Lymphadenopathy:    She has no cervical adenopathy.  Neurological: She is alert. She has normal reflexes. No cranial nerve deficit.  She exhibits normal muscle tone. Coordination normal.  Skin: Skin is warm and dry.  Psychiatric: She has a normal mood and affect. Her behavior is normal. Judgment and thought content normal.          Assessment & Plan:  Healthy female  Occasional HSV Zovirax when necessary  Vaginal dryness Premarin cream small amounts twice weekly  Fibrocystic breast changes recommend BSE monthly and you mammography

## 2012-08-20 ENCOUNTER — Encounter: Payer: Self-pay | Admitting: Family Medicine

## 2012-08-20 ENCOUNTER — Ambulatory Visit (INDEPENDENT_AMBULATORY_CARE_PROVIDER_SITE_OTHER): Payer: BC Managed Care – PPO | Admitting: Family Medicine

## 2012-08-20 VITALS — BP 114/78 | HR 84 | Temp 98.8°F | Wt 166.0 lb

## 2012-08-20 DIAGNOSIS — J40 Bronchitis, not specified as acute or chronic: Secondary | ICD-10-CM

## 2012-08-20 MED ORDER — AMOXICILLIN-POT CLAVULANATE 875-125 MG PO TABS: 1.0000 | ORAL_TABLET | Freq: Two times a day (BID) | ORAL | Status: DC

## 2012-08-20 NOTE — Progress Notes (Signed)
  Subjective:    Patient ID: Vanessa Barrera, female    DOB: [birth year], 57 y.o.   MRN: [id number]  HPI Here for 10 days of chest tightness and a dry cough. No fever. Using Nyquil.    Review of Systems  Constitutional: Negative.   HENT: Negative.   Eyes: Negative.   Respiratory: Positive for cough, chest tightness and wheezing. Negative for shortness of breath.   Cardiovascular: Negative.        Objective:   Physical Exam  Constitutional: She appears well-developed and well-nourished.  HENT:  Right Ear: External ear normal.  Left Ear: External ear normal.  Nose: Nose normal.  Mouth/Throat: Oropharynx is clear and moist.  Eyes: Conjunctivae normal are normal. Pupils are equal, round, and reactive to light.  Neck: Neck supple.  Pulmonary/Chest: Effort normal. No respiratory distress. She has no rales.       Scattered wheezes and rhonchi   Lymphadenopathy:    She has no cervical adenopathy.          Assessment & Plan:  Add Mucinex

## 2012-10-07 ENCOUNTER — Encounter: Payer: Self-pay | Admitting: Family Medicine

## 2012-10-07 ENCOUNTER — Ambulatory Visit (INDEPENDENT_AMBULATORY_CARE_PROVIDER_SITE_OTHER): Payer: BC Managed Care – PPO | Admitting: Family Medicine

## 2012-10-07 VITALS — BP 110/72 | HR 77 | Temp 98.3°F | Wt 164.0 lb

## 2012-10-07 DIAGNOSIS — M545 Low back pain, unspecified: Secondary | ICD-10-CM

## 2012-10-07 DIAGNOSIS — M546 Pain in thoracic spine: Secondary | ICD-10-CM

## 2012-10-07 MED ORDER — CYCLOBENZAPRINE HCL 10 MG PO TABS: 10.0000 mg | ORAL_TABLET | Freq: Three times a day (TID) | ORAL | Status: DC | PRN

## 2012-10-07 MED ORDER — DICLOFENAC SODIUM 75 MG PO TBEC: 75.0000 mg | DELAYED_RELEASE_TABLET | Freq: Two times a day (BID) | ORAL | Status: DC

## 2012-10-07 NOTE — Progress Notes (Signed)
  Subjective:    Patient ID: Vanessa Barrera, female    DOB: [birth year], 57 y.o.   MRN: [id number]  HPI Here for 4 weeks of constant dull aching in the middle and lower back. No recent trauma but she recently switched her job duties to where she now has to lift things from the floor up on top of high racks. Heat and Tylenol help a little. No symptoms in the legs.    Review of Systems  Constitutional: Negative.   Respiratory: Negative.   Cardiovascular: Negative.   Gastrointestinal: Negative.   Genitourinary: Negative.   Musculoskeletal: Positive for back pain.       Objective:   Physical Exam  Constitutional: She appears well-developed and well-nourished. No distress.  Cardiovascular: Normal rate, regular rhythm, normal heart sounds and intact distal pulses.   Pulmonary/Chest: Effort normal and breath sounds normal.  Musculoskeletal:  Tender along the middle and lower back areas with some spasm but full ROM          Assessment & Plan:  Muscle strains from overuse. Heat, rest, meds.

## 2012-10-15 ENCOUNTER — Other Ambulatory Visit: Payer: Self-pay

## 2012-10-15 DIAGNOSIS — Z1231 Encounter for screening mammogram for malignant neoplasm of breast: Secondary | ICD-10-CM

## 2012-11-10 ENCOUNTER — Ambulatory Visit
Admission: RE | Admit: 2012-11-10 | Discharge: 2012-11-10 | Disposition: A | Discharge status: Home / Self Care | Payer: BC Managed Care – PPO | Source: Ambulatory Visit

## 2012-11-10 DIAGNOSIS — Z1231 Encounter for screening mammogram for malignant neoplasm of breast: Secondary | ICD-10-CM

## 2012-11-28 ENCOUNTER — Other Ambulatory Visit: Payer: Self-pay | Admitting: Family Medicine

## 2013-05-27 ENCOUNTER — Encounter: Payer: Self-pay | Admitting: Family Medicine

## 2013-05-27 ENCOUNTER — Ambulatory Visit (INDEPENDENT_AMBULATORY_CARE_PROVIDER_SITE_OTHER): Payer: BC Managed Care – PPO | Admitting: Family Medicine

## 2013-05-27 VITALS — BP 120/80 | Temp 98.8°F | Wt 158.0 lb

## 2013-05-27 DIAGNOSIS — M25559 Pain in unspecified hip: Secondary | ICD-10-CM

## 2013-05-27 DIAGNOSIS — S335XXA Sprain of ligaments of lumbar spine, initial encounter: Secondary | ICD-10-CM

## 2013-05-27 DIAGNOSIS — N952 Postmenopausal atrophic vaginitis: Secondary | ICD-10-CM

## 2013-05-27 DIAGNOSIS — M25552 Pain in left hip: Secondary | ICD-10-CM

## 2013-05-27 LAB — CBC WITH DIFFERENTIAL/PLATELET
Basophils Relative: 0.6 % (ref 0.0–3.0)
Eosinophils Absolute: 0.1 10*3/uL (ref 0.0–0.7)
Eosinophils Relative: 4.5 % (ref 0.0–5.0)
HCT: 37.3 % (ref 36.0–46.0)
Hemoglobin: 12.6 g/dL (ref 12.0–15.0)
Lymphs Abs: 1.3 10*3/uL (ref 0.7–4.0)
MCHC: 33.8 g/dL (ref 30.0–36.0)
MCV: 81.6 fl (ref 78.0–100.0)
Monocytes Absolute: 0.3 10*3/uL (ref 0.1–1.0)
Neutro Abs: 1.6 10*3/uL (ref 1.4–7.7)
RBC: 4.57 Mil/uL (ref 3.87–5.11)
WBC: 3.3 10*3/uL — ABNORMAL LOW (ref 4.5–10.5)

## 2013-05-27 LAB — POCT URINALYSIS DIPSTICK
Ketones, UA: NEGATIVE
Protein, UA: NEGATIVE
Spec Grav, UA: 1.025
Urobilinogen, UA: 0.2

## 2013-05-27 LAB — TSH: TSH: 0.45 u[IU]/mL (ref 0.35–5.50)

## 2013-05-27 LAB — BASIC METABOLIC PANEL
CO2: 24 mEq/L (ref 19–32)
Calcium: 9.2 mg/dL (ref 8.4–10.5)
Chloride: 107 mEq/L (ref 96–112)
Creatinine, Ser: 0.8 mg/dL (ref 0.4–1.2)
Glucose, Bld: 83 mg/dL (ref 70–99)

## 2013-05-27 LAB — HEPATIC FUNCTION PANEL
ALT: 17 U/L (ref 0–35)
Bilirubin, Direct: 0 mg/dL (ref 0.0–0.3)
Total Bilirubin: 0.6 mg/dL (ref 0.3–1.2)

## 2013-05-27 LAB — LIPID PANEL
HDL: 53.4 mg/dL (ref >39.00)
Total CHOL/HDL Ratio: 3
VLDL: 10.4 mg/dL (ref 0.0–40.0)

## 2013-05-27 MED ORDER — ESTROGENS, CONJUGATED 0.625 MG/GM VA CREA: 1.0000 | TOPICAL_CREAM | Freq: Every day | VAGINAL | Status: DC

## 2013-05-27 NOTE — Progress Notes (Signed)
Pre visit review using our clinic review tool, if applicable. No additional management support is needed unless otherwise documented below in the visit note. 

## 2013-05-27 NOTE — Progress Notes (Signed)
  Subjective:    Patient ID: Vanessa Barrera, female    DOB: [birth year], 57 y.o.   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 She relates that to her work and doing a lot of lifting. She's been taking Motrin 800 mg daily with some fairly good success  She's had stress-related headaches related to some issues going on in the family  She's had a history of left hip pain for 2 years it seems to be bothering her more. No history of trauma.  She's also having vaginal dryness  She has no fever chills nausea vomiting diarrhea weight loss etc.    Review of Systems    review of systems otherwise negative Objective:   Physical Exam Well-developed and nourished female no acute distress examination the spine is normal there is no palpable tenderness. Supine position straight leg raising was negative sensation muscle strength reflexes all within normal limits. There is decreased range of motion 30 right 45 on the left consistent with some osteoarthritis.  ENT exam normal  Eye exam normal no papilledema       Assessment & Plan:  Osteoarthritis plan outlined exercise and Motrin  Postmenopausal vaginal dryness Premarin vaginal cream

## 2013-05-27 NOTE — Patient Instructions (Signed)
Motrin 400 mg twice daily with food  Walk 30 minutes daily  Premarin vaginal cream as directed  Labs today  I will call you I get the report next week

## 2013-06-03 ENCOUNTER — Telehealth: Payer: Self-pay | Admitting: Family Medicine

## 2013-06-03 NOTE — Telephone Encounter (Signed)
Pt would like you to call her at her office tonite w/ those results

## 2013-06-04 NOTE — Telephone Encounter (Signed)
Patient is aware of lab results.

## 2013-06-23 ENCOUNTER — Ambulatory Visit (INDEPENDENT_AMBULATORY_CARE_PROVIDER_SITE_OTHER): Payer: BC Managed Care – PPO | Admitting: Family Medicine

## 2013-06-23 ENCOUNTER — Encounter: Payer: Self-pay | Admitting: Family Medicine

## 2013-06-23 VITALS — BP 110/70 | Temp 98.0°F | Wt 159.0 lb

## 2013-06-23 DIAGNOSIS — J069 Acute upper respiratory infection, unspecified: Secondary | ICD-10-CM

## 2013-06-23 MED ORDER — HYDROCODONE-HOMATROPINE 5-1.5 MG/5ML PO SYRP: 5.0000 mL | ORAL_SOLUTION | Freq: Three times a day (TID) | ORAL | Status: DC | PRN

## 2013-06-23 NOTE — Patient Instructions (Signed)
Drink lots of water  Chloraseptic lozengers   Hydromet 1/2-1 teaspoon 3 times daily. For sore throat  Motrin 600 mg twice daily with food

## 2013-06-23 NOTE — Progress Notes (Signed)
   Subjective:    Patient ID: Charlann Boxer, female    DOB: 1955/07/20, 57 y.o.   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 No fever earache cough vomiting diarrhea.   Review of Systems View of systems negative    Objective:   Physical Exam  Well-developed well-nourished female no acute distress vital signs stable she's afebrile HEENT negative neck was supple no adenopathy      Assessment & Plan:  Viral syndrome plan treat symptomatically

## 2013-06-23 NOTE — Progress Notes (Signed)
Pre visit review using our clinic review tool, if applicable. No additional management support is needed unless otherwise documented below in the visit note. 

## 2013-07-31 ENCOUNTER — Other Ambulatory Visit: Payer: BC Managed Care – PPO

## 2013-08-06 ENCOUNTER — Encounter: Payer: BC Managed Care – PPO | Admitting: Family Medicine

## 2013-08-13 ENCOUNTER — Other Ambulatory Visit (HOSPITAL_COMMUNITY)
Admission: RE | Admit: 2013-08-13 | Discharge: 2013-08-13 | Disposition: A | Discharge status: Home / Self Care | Payer: BC Managed Care – PPO | Source: Ambulatory Visit | Attending: Family Medicine | Admitting: Family Medicine

## 2013-08-13 ENCOUNTER — Ambulatory Visit (INDEPENDENT_AMBULATORY_CARE_PROVIDER_SITE_OTHER): Payer: BC Managed Care – PPO | Admitting: Family Medicine

## 2013-08-13 ENCOUNTER — Encounter: Payer: Self-pay | Admitting: Family Medicine

## 2013-08-13 VITALS — BP 120/80 | Temp 99.2°F | Ht 65.5 in | Wt 160.0 lb

## 2013-08-13 DIAGNOSIS — N952 Postmenopausal atrophic vaginitis: Secondary | ICD-10-CM

## 2013-08-13 DIAGNOSIS — S239XXA Sprain of unspecified parts of thorax, initial encounter: Secondary | ICD-10-CM

## 2013-08-13 DIAGNOSIS — B977 Papillomavirus as the cause of diseases classified elsewhere: Secondary | ICD-10-CM

## 2013-08-13 DIAGNOSIS — Z01419 Encounter for gynecological examination (general) (routine) without abnormal findings: Secondary | ICD-10-CM | POA: Insufficient documentation

## 2013-08-13 DIAGNOSIS — M25559 Pain in unspecified hip: Secondary | ICD-10-CM

## 2013-08-13 DIAGNOSIS — N63 Unspecified lump in unspecified breast: Secondary | ICD-10-CM

## 2013-08-13 MED ORDER — ACYCLOVIR 400 MG PO TABS: ORAL_TABLET | ORAL | Status: DC

## 2013-08-13 MED ORDER — ESTROGENS, CONJUGATED 0.625 MG/GM VA CREA: 1.0000 | TOPICAL_CREAM | Freq: Every day | VAGINAL | Status: DC

## 2013-08-13 NOTE — Patient Instructions (Signed)
Acyclovir 400 mg,,,,,,,, one tablet daily,,,,,,,,,,,,, if despite doing this you have outbreak,,,,,,,,, then take 13 times daily until the symptoms resolve  Premarin vaginal cream,,,,, small amounts twice weekly  Motrin 600 mg twice daily with food  Return in one year sooner if any problems

## 2013-08-13 NOTE — Progress Notes (Signed)
Pre visit review using our clinic review tool, if applicable. No additional management support is needed unless otherwise documented below in the visit note. 

## 2013-08-13 NOTE — Progress Notes (Signed)
   Subjective:    Patient ID: Vanessa Barrera, female    DOB: Aug 17, 1955, 58 y.o.   MRN: [id number]  HPI Mechele Claude is a 58 year old married female nonsmoker who comes in today for general physical examination  She has a history of HSV vaginal and takes acyclovir when necessary  She uses Premarin vaginal cream twice weekly for vaginal dryness  She has some degenerative disease cervical lumbar also some arthritis in her hip. She's not taking any medication. 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 She tries to walk but was not able to do she works a lot.  She gets routine eye care, dental care, BSE monthly, and you mammography, colonoscopy and GI, vaccinations up-to-date   Review of Systems  Constitutional: Negative.   HENT: Negative.   Eyes: Negative.   Respiratory: Negative.   Cardiovascular: Negative.   Gastrointestinal: Negative.   Endocrine: Negative.   Genitourinary: Negative.   Musculoskeletal: Negative.   Allergic/Immunologic: Negative.   Neurological: Negative.   Hematological: Negative.   Psychiatric/Behavioral: Negative.        Objective:   Physical Exam  Nursing note and vitals reviewed. Constitutional: She appears well-developed and well-nourished.  HENT:  Head: Normocephalic and atraumatic.  Right Ear: External ear normal.  Left Ear: External ear normal.  Nose: Nose normal.  Mouth/Throat: Oropharynx is clear and moist.  Eyes: EOM are normal. Pupils are equal, round, and reactive to light.  Neck: Normal range of motion. Neck supple. No thyromegaly present.  Cardiovascular: Normal rate, regular rhythm, normal heart sounds and intact distal pulses.  Exam reveals no gallop and no friction rub.   No murmur heard. Pulmonary/Chest: Effort normal and breath sounds normal.  Abdominal: Soft. Bowel sounds are normal. She exhibits no distension and no mass. There is no tenderness. There is no  rebound.  Genitourinary: Vagina normal and uterus normal. Guaiac negative stool. No vaginal discharge found.  Bilateral breast exam normal shows multiple fibrocystic changes of been previously present  Musculoskeletal: Normal range of motion.  Lymphadenopathy:    She has no cervical adenopathy.  Neurological: She is alert. She has normal reflexes. No cranial nerve deficit. She exhibits normal muscle tone. Coordination normal.  Skin: Skin is warm and dry.  Psychiatric: She has a normal mood and affect. Her behavior is normal. Judgment and thought content normal.          Assessment & Plan:  Healthy female  History of recurrent HSV vaginal,,,,,,,, daily acyclovir  Postmenopausal vaginal dryness hormonal cream twice weekly  Osteoarthritis Motrin  600 mg twice a day with food

## 2013-09-14 ENCOUNTER — Telehealth: Payer: Self-pay | Admitting: Family Medicine

## 2013-09-14 DIAGNOSIS — K625 Hemorrhage of anus and rectum: Secondary | ICD-10-CM

## 2013-09-14 NOTE — Telephone Encounter (Signed)
Pt states she saw blood in her stool on Sat. Pt states now her stool is soft.  Has not seen any blood since then. Would like to see dr todd. pls advise

## 2013-09-14 NOTE — Telephone Encounter (Signed)
Pt is returning rachel call °

## 2013-09-14 NOTE — Telephone Encounter (Signed)
Left message on machine returning patient's call 

## 2013-09-14 NOTE — Telephone Encounter (Signed)
Left message on machine for patient to return our call 

## 2013-09-15 NOTE — Telephone Encounter (Signed)
Spoke with patient and referral placed

## 2013-09-15 NOTE — Telephone Encounter (Signed)
Pt states she is at home. pls call on cell phone.

## 2013-09-24 ENCOUNTER — Encounter: Payer: Self-pay | Admitting: Internal Medicine

## 2013-11-24 ENCOUNTER — Ambulatory Visit (INDEPENDENT_AMBULATORY_CARE_PROVIDER_SITE_OTHER): Payer: BC Managed Care – PPO | Admitting: Internal Medicine

## 2013-11-24 ENCOUNTER — Encounter: Payer: Self-pay | Admitting: Internal Medicine

## 2013-11-24 VITALS — BP 112/70 | HR 68 | Ht 65.5 in | Wt 156.6 lb

## 2013-11-24 DIAGNOSIS — K625 Hemorrhage of anus and rectum: Secondary | ICD-10-CM

## 2013-11-24 MED ORDER — MOVIPREP 100 G PO SOLR: 1.0000 | Freq: Once | ORAL | Status: DC

## 2013-11-24 NOTE — Patient Instructions (Signed)

## 2013-11-24 NOTE — Progress Notes (Signed)
HISTORY OF PRESENT ILLNESS:  Vanessa Barrera is a 58 y.o. female with past medical history as listed below. She is referred today, by Dr. Sherren Mocha, regarding new onset rectal bleeding. Approximately 2 months ago, the patient describes moderate rectal bleeding with bowel movement. Red blood was noticed in the toilet bowl and associated with the stool. She had this on a second occasion. There is no associated abdominal or rectal discomfort. Thereafter, more maroon stools which she thought might be secondary to consuming beets. No evidence for bleeding in recent weeks. Hemoglobin November 2014 was 12.6. She describes her bowel habits is regular generally having soft formed bowel movements daily. This is unchanged. GI review of systems is otherwise negative. She did undergo complete colonoscopy with Dr. Verl Blalock April 2008. Examination revealed left-sided diverticulosis but was otherwise negative. No mention of internal hemorrhoids.  REVIEW OF SYSTEMS:  All non-GI ROS negative upon review  Past Medical History  Diagnosis Date  . Allergic rhinitis   . Hypertension   . Depression   . Diverticulosis   . Lump in female breast   . Postmenopausal atrophic vaginitis   . HPV in female   . Arthritis   . Degenerative disc disease, lumbar     Past Surgical History  Procedure Laterality Date  . Breast biopsies      x3  . Fractured left ankle    . Fractured left pelvic bone    . Torn ligament left knee    . Left ankle surgery    . Cb      x2    Social History SERAYAH YAZDANI  reports that she quit smoking about 23 years ago. She has never used smokeless tobacco. She reports that she drinks alcohol. She reports that she does not use illicit drugs.  family history includes Alcohol abuse in her other; Cancer in her other; Diabetes in her other; Heart disease in her other; Hypertension in her other; Stroke in her other. There is no history of Colon cancer, Colon polyps, or Stomach cancer.  No  Known Allergies     PHYSICAL EXAMINATION: Vital signs: BP 112/70  Pulse 68  Ht 5' 5.5" (1.664 m)  Wt 156 lb 9.6 oz (71.033 kg)  BMI 25.65 kg/m2  Constitutional: generally well-appearing, no acute distress Psychiatric: alert and oriented x3, cooperative Eyes: extraocular movements intact, anicteric, conjunctiva pink Mouth: oral pharynx moist, no lesions Neck: supple no lymphadenopathy Cardiovascular: heart regular rate and rhythm, no murmur Lungs: clear to auscultation bilaterally Abdomen: soft, nontender, nondistended, no obvious ascites, no peritoneal signs, normal bowel sounds, no organomegaly Rectal: Deferred until colonoscopy Extremities: no lower extremity edema bilaterally Skin: no lesions on visible extremities Neuro: No focal deficits. No asterixis.    ASSESSMENT:  #1. Several episodes of rectal bleeding as described. Etiology unclear. Rule out colorectal neoplasia #2. Diverticulosis on colonoscopy April 2008   PLAN:  #1. Colonoscopy to further evaluate rectal bleeding.The nature of the procedure, as well as the risks, benefits, and alternatives were carefully and thoroughly reviewed with the patient. Ample time for discussion and questions allowed. The patient understood, was satisfied, and agreed to proceed. Movi prep prescribed. Patient instructed on its use

## 2013-12-02 ENCOUNTER — Encounter: Payer: Self-pay | Admitting: Internal Medicine

## 2013-12-10 ENCOUNTER — Encounter: Payer: Self-pay | Admitting: Internal Medicine

## 2013-12-10 ENCOUNTER — Ambulatory Visit (AMBULATORY_SURGERY_CENTER): Payer: BC Managed Care – PPO | Admitting: Internal Medicine

## 2013-12-10 VITALS — BP 108/68 | HR 55 | Temp 96.3°F | Resp 15 | Ht 65.0 in | Wt 156.0 lb

## 2013-12-10 DIAGNOSIS — K625 Hemorrhage of anus and rectum: Secondary | ICD-10-CM

## 2013-12-10 DIAGNOSIS — D126 Benign neoplasm of colon, unspecified: Secondary | ICD-10-CM

## 2013-12-10 MED ORDER — SODIUM CHLORIDE 0.9 % IV SOLN: 500.0000 mL | INTRAVENOUS | Status: DC

## 2013-12-10 NOTE — Progress Notes (Signed)
Called to room to assist during endoscopic procedure.  Patient ID and intended procedure confirmed with present staff. Received instructions for my participation in the procedure from the performing physician.  

## 2013-12-10 NOTE — Progress Notes (Signed)
A/ox3 pleased with MAC, report to Jane RN 

## 2013-12-10 NOTE — Patient Instructions (Signed)
YOU HAD AN ENDOSCOPIC PROCEDURE TODAY AT THE [facility]: Refer to the procedure report that was given to you for any specific questions about what was found during the examination.  If the procedure report does not answer your questions, please call your gastroenterologist to clarify.  If you requested that your care partner not be given the details of your procedure findings, then the procedure report has been included in a sealed envelope for you to review at your convenience later.  YOU SHOULD EXPECT: Some feelings of bloating in the abdomen. Passage of more gas than usual.  Walking can help get rid of the air that was put into your GI tract during the procedure and reduce the bloating. If you had a lower endoscopy (such as a colonoscopy or flexible sigmoidoscopy) you may notice spotting of blood in your stool or on the toilet paper. If you underwent a bowel prep for your procedure, then you may not have a normal bowel movement for a few days.  DIET: Your first meal following the procedure should be a light meal and then it is ok to progress to your normal diet.  A half-sandwich or bowl of soup is an example of a good first meal.  Heavy or fried foods are harder to digest and may make you feel nauseous or bloated.  Likewise meals heavy in dairy and vegetables can cause extra gas to form and this can also increase the bloating.  Drink plenty of fluids but you should avoid alcoholic beverages for 24 hours.  ACTIVITY: Your care partner should take you home directly after the procedure.  You should plan to take it easy, moving slowly for the rest of the day.  You can resume normal activity the day after the procedure however you should NOT DRIVE or use heavy machinery for 24 hours (because of the sedation medicines used during the test).    SYMPTOMS TO REPORT IMMEDIATELY: A gastroenterologist can be reached at any hour.  During normal business hours, 8:30 AM to 5:00 PM Monday through Friday,  call [phone].  After hours and on weekends, please call the GI answering service at [phone] who will take a message and have the physician on call contact you.   Following lower endoscopy (colonoscopy or flexible sigmoidoscopy):  Excessive amounts of blood in the stool  Significant tenderness or worsening of abdominal pains  Swelling of the abdomen that is new, acute  Fever of 100F or higher    FOLLOW UP: If any biopsies were taken you will be contacted by phone or by letter within the next 1-3 weeks.  Call your gastroenterologist if you have not heard about the biopsies in 3 weeks.  Our staff will call the home number listed on your records the next business day following your procedure to check on you and address any questions or concerns that you may have at that time regarding the information given to you following your procedure. This is a courtesy call and so if there is no answer at the home number and we have not heard from you through the emergency physician on call, we will assume that you have returned to your regular daily activities without incident.  SIGNATURES/CONFIDENTIALITY: You and/or your care partner have signed paperwork which will be entered into your electronic medical record.  These signatures attest to the fact that that the information above on your After Visit Summary has been reviewed and is understood.  Full responsibility of the confidentiality   of this discharge information lies with you and/or your care-partner.  Polyp,diverticulosis, hemorrhoid and high fiber diet information given.  Dr. Henrene Pastor will advise you about timing for next colonoscopy after pathology results are reviewed.
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
## 2013-12-10 NOTE — Op Note (Signed)
[facility]  [address] [place], [id number]   COLONOSCOPY PROCEDURE REPORT  PATIENT: Vanessa Barrera, Vanessa Barrera  MR#: [id number] BIRTHDATE: 08/15/1955 , 57  yrs. old GENDER: Female ENDOSCOPIST: Eustace Quail, MD REFERRED IO:NGEXBM Self, M.D. PROCEDURE DATE:  12/10/2013 PROCEDURE:   Colonoscopy with snare polypectomy x 2 First Screening Colonoscopy - Avg.  risk and is 50 yrs.  old or older - No.  Prior Negative Screening - Now for repeat screening. N/A  History of Adenoma - Now for follow-up colonoscopy & has been > or = to 3 yrs.  N/A  Polyps Removed Today? Yes. ASA CLASS:   Class II INDICATIONS:rectal bleeding.   Last exam 2008 (no polyps) MEDICATIONS: MAC sedation, administered by CRNA and propofol (Diprivan) 240mg  IV  DESCRIPTION OF PROCEDURE:   After the risks benefits and alternatives of the procedure were thoroughly explained, informed consent was obtained.  A digital rectal exam revealed no abnormalities of the rectum.   The LB [id number]  endoscope was introduced through the anus and advanced to the cecum, which was identified by both the appendix and ileocecal valve. No adverse events experienced.   The quality of the prep was excellent, using MoviPrep  The instrument was then slowly withdrawn as the colon was fully examined.   COLON FINDINGS: Two diminutive polyps were found in the ascending colon.  A polypectomy was performed with a cold snare.  The resection was complete and the polyp tissue was completely retrieved.   Moderate diverticulosis was noted The finding was in the left colon.   The colon mucosa was otherwise normal. Retroflexed views revealed internal hemorrhoids. The time to cecum=2 minutes 56 seconds.  Withdrawal time=11 minutes 22 seconds. The scope was withdrawn and the procedure completed. COMPLICATIONS: There were no complications.  ENDOSCOPIC IMPRESSION: 1.   Two diminutive polyps were found in the ascending  colon; polypectomy was performed with a cold snare 2.   Moderate diverticulosis was noted in the left colon 3.   The colon mucosa was otherwise normal  RECOMMENDATIONS: 1. Repeat colonoscopy in 5 years if polyp adenomatous; otherwise 10 years   eSigned:  Eustace Quail, MD 12/10/2013 8:37 AM   cc: Dorena Cookey, MD and The Patient

## 2013-12-11 ENCOUNTER — Telehealth: Payer: Self-pay | Admitting: *Deleted

## 2013-12-11 NOTE — Telephone Encounter (Signed)
No answer, left message to call if questions or concerns. 

## 2013-12-15 ENCOUNTER — Encounter: Payer: Self-pay | Admitting: Internal Medicine

## 2013-12-16 ENCOUNTER — Other Ambulatory Visit: Payer: Self-pay

## 2013-12-16 DIAGNOSIS — Z1231 Encounter for screening mammogram for malignant neoplasm of breast: Secondary | ICD-10-CM

## 2013-12-31 ENCOUNTER — Ambulatory Visit
Admission: RE | Admit: 2013-12-31 | Discharge: 2013-12-31 | Disposition: A | Discharge status: Home / Self Care | Payer: BC Managed Care – PPO | Source: Ambulatory Visit

## 2013-12-31 DIAGNOSIS — Z1231 Encounter for screening mammogram for malignant neoplasm of breast: Secondary | ICD-10-CM

## 2014-02-06 ENCOUNTER — Encounter (HOSPITAL_COMMUNITY): Payer: Self-pay | Admitting: Emergency Medicine

## 2014-02-06 ENCOUNTER — Emergency Department (HOSPITAL_COMMUNITY)
Admission: EM | Admit: 2014-02-06 | Discharge: 2014-02-06 | Disposition: A | Discharge status: Home / Self Care | Payer: BC Managed Care – PPO | Attending: Emergency Medicine | Admitting: Emergency Medicine

## 2014-02-06 DIAGNOSIS — R1032 Left lower quadrant pain: Secondary | ICD-10-CM | POA: Insufficient documentation

## 2014-02-06 DIAGNOSIS — Z8709 Personal history of other diseases of the respiratory system: Secondary | ICD-10-CM | POA: Insufficient documentation

## 2014-02-06 DIAGNOSIS — Z8619 Personal history of other infectious and parasitic diseases: Secondary | ICD-10-CM | POA: Insufficient documentation

## 2014-02-06 DIAGNOSIS — K5792 Diverticulitis of intestine, part unspecified, without perforation or abscess without bleeding: Secondary | ICD-10-CM

## 2014-02-06 DIAGNOSIS — Z79899 Other long term (current) drug therapy: Secondary | ICD-10-CM | POA: Insufficient documentation

## 2014-02-06 DIAGNOSIS — I10 Essential (primary) hypertension: Secondary | ICD-10-CM | POA: Insufficient documentation

## 2014-02-06 DIAGNOSIS — Z8742 Personal history of other diseases of the female genital tract: Secondary | ICD-10-CM | POA: Insufficient documentation

## 2014-02-06 DIAGNOSIS — Z8739 Personal history of other diseases of the musculoskeletal system and connective tissue: Secondary | ICD-10-CM | POA: Insufficient documentation

## 2014-02-06 DIAGNOSIS — K5732 Diverticulitis of large intestine without perforation or abscess without bleeding: Secondary | ICD-10-CM | POA: Insufficient documentation

## 2014-02-06 DIAGNOSIS — Z8659 Personal history of other mental and behavioral disorders: Secondary | ICD-10-CM | POA: Insufficient documentation

## 2014-02-06 DIAGNOSIS — Z87891 Personal history of nicotine dependence: Secondary | ICD-10-CM | POA: Insufficient documentation

## 2014-02-06 LAB — COMPREHENSIVE METABOLIC PANEL
ALT: 19 U/L (ref 0–35)
AST: 22 U/L (ref 0–37)
Albumin: 4 g/dL (ref 3.5–5.2)
Alkaline Phosphatase: 68 U/L (ref 39–117)
Anion gap: 11 (ref 5–15)
BUN: 15 mg/dL (ref 6–23)
CO2: 27 mEq/L (ref 19–32)
Calcium: 10.1 mg/dL (ref 8.4–10.5)
Chloride: 103 mEq/L (ref 96–112)
Creatinine, Ser: 0.89 mg/dL (ref 0.50–1.10)
GFR calc Af Amer: 82 mL/min — ABNORMAL LOW (ref >90)
GFR calc non Af Amer: 71 mL/min — ABNORMAL LOW (ref >90)
Glucose, Bld: 84 mg/dL (ref 70–99)
Potassium: 4.7 mEq/L (ref 3.7–5.3)
Sodium: 141 mEq/L (ref 137–147)
Total Bilirubin: 0.6 mg/dL (ref 0.3–1.2)
Total Protein: 7.3 g/dL (ref 6.0–8.3)

## 2014-02-06 LAB — URINALYSIS, ROUTINE W REFLEX MICROSCOPIC
Bilirubin Urine: NEGATIVE
Glucose, UA: NEGATIVE mg/dL
Ketones, ur: NEGATIVE mg/dL
Leukocytes, UA: NEGATIVE
Nitrite: NEGATIVE
Protein, ur: NEGATIVE mg/dL
Specific Gravity, Urine: 1.014 (ref 1.005–1.030)
Urobilinogen, UA: 0.2 mg/dL (ref 0.0–1.0)
pH: 5 (ref 5.0–8.0)

## 2014-02-06 LAB — CBC WITH DIFFERENTIAL/PLATELET
Basophils Absolute: 0 10*3/uL (ref 0.0–0.1)
Basophils Relative: 0 % (ref 0–1)
Eosinophils Absolute: 0.2 10*3/uL (ref 0.0–0.7)
Eosinophils Relative: 4 % (ref 0–5)
HCT: 39.8 % (ref 36.0–46.0)
Hemoglobin: 13 g/dL (ref 12.0–15.0)
Lymphocytes Relative: 20 % (ref 12–46)
Lymphs Abs: 1.2 10*3/uL (ref 0.7–4.0)
MCH: 27.4 pg (ref 26.0–34.0)
MCHC: 32.7 g/dL (ref 30.0–36.0)
MCV: 83.8 fL (ref 78.0–100.0)
Monocytes Absolute: 0.6 10*3/uL (ref 0.1–1.0)
Monocytes Relative: 9 % (ref 3–12)
Neutro Abs: 4.2 10*3/uL (ref 1.7–7.7)
Neutrophils Relative %: 67 % (ref 43–77)
Platelets: 217 10*3/uL (ref 150–400)
RBC: 4.75 MIL/uL (ref 3.87–5.11)
RDW: 14.6 % (ref 11.5–15.5)
WBC: 6.3 10*3/uL (ref 4.0–10.5)

## 2014-02-06 LAB — URINE MICROSCOPIC-ADD ON

## 2014-02-06 LAB — LIPASE, BLOOD: Lipase: 29 U/L (ref 11–59)

## 2014-02-06 MED ORDER — METRONIDAZOLE 500 MG PO TABS: 500.0000 mg | ORAL_TABLET | Freq: Three times a day (TID) | ORAL | Status: DC

## 2014-02-06 MED ORDER — HYDROCODONE-ACETAMINOPHEN 5-325 MG PO TABS: 1.0000 | ORAL_TABLET | Freq: Four times a day (QID) | ORAL | Status: DC | PRN

## 2014-02-06 MED ORDER — ONDANSETRON HCL 4 MG PO TABS: 4.0000 mg | ORAL_TABLET | Freq: Four times a day (QID) | ORAL | Status: DC

## 2014-02-06 MED ORDER — CIPROFLOXACIN HCL 500 MG PO TABS: 500.0000 mg | ORAL_TABLET | Freq: Two times a day (BID) | ORAL | Status: DC

## 2014-02-06 MED ORDER — CIPROFLOXACIN HCL 500 MG PO TABS
500.0000 mg | ORAL_TABLET | Freq: Once | ORAL | Status: AC
  Administered 2014-02-06: 500 mg via ORAL
  Filled 2014-02-06: qty 1

## 2014-02-06 MED ORDER — ONDANSETRON HCL 4 MG/2ML IJ SOLN
4.0000 mg | Freq: Once | INTRAMUSCULAR | Status: AC
  Administered 2014-02-06: 4 mg via INTRAVENOUS
  Filled 2014-02-06: qty 2

## 2014-02-06 MED ORDER — METRONIDAZOLE 500 MG PO TABS
500.0000 mg | ORAL_TABLET | Freq: Once | ORAL | Status: AC
  Administered 2014-02-06: 500 mg via ORAL
  Filled 2014-02-06: qty 1

## 2014-02-06 MED ORDER — SODIUM CHLORIDE 0.9 % IV BOLUS (SEPSIS)
1000.0000 mL | Freq: Once | INTRAVENOUS | Status: AC
  Administered 2014-02-06: 1000 mL via INTRAVENOUS

## 2014-02-06 MED ORDER — MORPHINE SULFATE 4 MG/ML IJ SOLN
4.0000 mg | Freq: Once | INTRAMUSCULAR | Status: AC
  Administered 2014-02-06: 4 mg via INTRAVENOUS
  Filled 2014-02-06: qty 1

## 2014-02-06 NOTE — ED Notes (Signed)
Pt states that she has been having LLQ pain since Thursday evening.  Denies NVD.  Last BM was this morning.

## 2014-02-06 NOTE — ED Notes (Signed)
Patient with left lower abdominal pain since Thursday evening which has progressively worsened.  Started out as a soreness when she pressed on the area and now this morning earlier pain was severe.  Patient had a bowel movement which improved the pain.  Patient rates pain as a 4 now.  Denies nausea, vomiting, fever, chills.  Patient has history of diverticulosis.

## 2014-02-06 NOTE — ED Provider Notes (Signed)
CSN: [id number]     Arrival date & time 02/06/14  1030 History   First MD Initiated Contact with Patient 02/06/14 1055     Chief Complaint  Patient presents with  . Abdominal Pain     (Consider location/radiation/quality/duration/timing/severity/associated sxs/prior Treatment) HPI Comments: Patient is a 58 year old female with history of hypertension, depression, diverticulosis, and HPV who presents to the emergency department today for left lower quadrant pain. She reports that her pain began on Thursday it has gradually worsened since that time. She describes the pain as sore. The pain does not radiate. She denies any diarrhea. Her last bowel movement was today which was generally normal for her. She has had 2 bowel movements which is more frequent than normal today. Her bowel movements improved her pain. No nausea or vomiting. She denies any fevers, chills, shortness of breath, chest pain. Her last colonoscopy was in May of this year. Does have moderate diverticulosis and 2 polyps.  Patient is a 58 y.o. female presenting with abdominal pain. The history is provided by the patient. No language interpreter was used.  Abdominal Pain Associated symptoms: no chest pain, no chills, no constipation, no fever, no nausea, no shortness of breath and no vomiting     Past Medical History  Diagnosis Date  . Allergic rhinitis   . Hypertension   . Depression   . Diverticulosis   . Lump in female breast   . Postmenopausal atrophic vaginitis   . HPV in female   . Arthritis   . Degenerative disc disease, lumbar    Past Surgical History  Procedure Laterality Date  . Breast biopsies      x3  . Fractured left ankle    . Fractured left pelvic bone    . Torn ligament left knee    . Left ankle surgery    . Cb      x2   Family History  Problem Relation Age of Onset  . Alcohol abuse Other   . Cancer Other     breast  . Heart disease Other   . Diabetes Other   . Hypertension Other   . Stroke  Other   . Colon cancer Neg Hx   . Colon polyps Neg Hx   . Stomach cancer Neg Hx    History  Substance Use Topics  . Smoking status: Former Smoker    Quit date: 10/31/1990  . Smokeless tobacco: Never Used  . Alcohol Use: Yes     Comment: rare   OB History   Grav Para Term Preterm Abortions TAB SAB Ect Mult Living                 Review of Systems  Constitutional: Negative for fever and chills.  Respiratory: Negative for shortness of breath.   Cardiovascular: Negative for chest pain.  Gastrointestinal: Positive for abdominal pain. Negative for nausea, vomiting and constipation.  All other systems reviewed and are negative.     Allergies  Review of patient's allergies indicates no known allergies.  Home Medications   Prior to Admission medications   Medication Sig Start Date End Date Taking? Authorizing Provider  acyclovir (ZOVIRAX) 400 MG tablet 1 tablet every morning 08/13/13  Yes Dorena Cookey, MD  calcium carbonate (TUMS - DOSED IN MG ELEMENTAL CALCIUM) 500 MG chewable tablet Chew 2 tablets by mouth daily.   Yes Historical Provider, MD  conjugated estrogens (PREMARIN) vaginal cream Place 1 Applicatorful vaginally daily. 08/13/13  Yes Dorena Cookey, MD  Multiple Vitamin (MULTIVITAMIN WITH MINERALS) TABS tablet Take 1 tablet by mouth daily.   Yes Historical Provider, MD   BP 117/74  Pulse 72  Temp(Src) 97.9 F (36.6 C) (Oral)  Resp 18  SpO2 98% Physical Exam  Nursing note and vitals reviewed. Constitutional: She is oriented to person, place, and time. She appears well-developed and well-nourished. No distress.  HENT:  Head: Normocephalic and atraumatic.  Right Ear: External ear normal.  Left Ear: External ear normal.  Nose: Nose normal.  Mouth/Throat: Oropharynx is clear and moist.  Eyes: Conjunctivae are normal.  Neck: Normal range of motion.  Cardiovascular: Normal rate, regular rhythm and normal heart sounds.   Pulmonary/Chest: Effort normal and breath  sounds normal. No stridor. No respiratory distress. She has no wheezes. She has no rales.  Abdominal: Soft. Bowel sounds are normal. She exhibits no distension. There is tenderness in the left lower quadrant. There is no rigidity, no rebound and no guarding.  Musculoskeletal: Normal range of motion.  Neurological: She is alert and oriented to person, place, and time. She has normal strength.  Skin: Skin is warm and dry. She is not diaphoretic. No erythema.  Psychiatric: She has a normal mood and affect. Her behavior is normal.    ED Course  Procedures (including critical care time) Labs Review Labs Reviewed  COMPREHENSIVE METABOLIC PANEL - Abnormal; Notable for the following:    GFR calc non Af Amer 71 (*)    GFR calc Af Amer 82 (*)    All other components within normal limits  URINALYSIS, ROUTINE W REFLEX MICROSCOPIC - Abnormal; Notable for the following:    Hgb urine dipstick SMALL (*)    All other components within normal limits  CBC WITH DIFFERENTIAL  LIPASE, BLOOD  URINE MICROSCOPIC-ADD ON    Imaging Review No results found.   EKG Interpretation None      MDM   Final diagnoses:  Diverticulitis of intestine without perforation or abscess without bleeding    Patient presents to ED with LLQ pain since Thursday. Recent coloscopy shows moderate diverticulosis. Patient does not have an elevated WBC count and is afebrile and well appearing. Patient with LLQ pain and convincing story for diverticulitis. Patient was offered CT scan of abdomen as well as empiric treatment. At this time we will not radiate patient and treat with Cipro and Flagyl. Discussed strict return instructions. Patient is reliable for follow up. Vital signs stable for discharge. Discussed case with Dr. Wilson Singer who agrees with plan. Patient / Family / Caregiver informed of clinical course, understand medical decision-making process, and agree with plan.    Elwyn Lade, PA-C 02/07/14 [phone]

## 2014-02-06 NOTE — Discharge Instructions (Signed)
Diverticulitis °Diverticulitis is when small pockets that have formed in your colon (large intestine) become infected or swollen. °HOME CARE °· Follow your doctor's instructions. °· Follow a special diet if told by your doctor. °· When you feel better, your doctor may tell you to change your diet. You may be told to eat a lot of fiber. Fruits and vegetables are good sources of fiber. Fiber makes it easier to poop (have bowel movements). °· Take supplements or probiotics as told by your doctor. °· Only take medicines as told by your doctor. °· Keep all follow-up visits with your doctor. °GET HELP IF: °· Your pain does not get better. °· You have a hard time eating food. °· You are not pooping like normal. °GET HELP RIGHT AWAY IF: °· Your pain gets worse. °· Your problems do not get better. °· Your problems suddenly get worse. °· You have a fever. °· You keep throwing up (vomiting). °· You have bloody or black, tarry poop (stool). °MAKE SURE YOU:  °· Understand these instructions. °· Will watch your condition. °· Will get help right away if you are not doing well or get worse. °Document Released: 12/19/2007 Document Revised: 07/07/2013 Document Reviewed: 05/27/2013 °Exit[employer]® Patient Information ©2015 [employer]. This information is not intended to replace advice given to you by your health care provider. Make sure you discuss any questions you have with your health care provider. ° °

## 2014-02-10 ENCOUNTER — Encounter: Payer: Self-pay | Admitting: Family Medicine

## 2014-02-10 ENCOUNTER — Ambulatory Visit (INDEPENDENT_AMBULATORY_CARE_PROVIDER_SITE_OTHER): Payer: BC Managed Care – PPO | Admitting: Family Medicine

## 2014-02-10 VITALS — BP 110/80 | HR 56 | Temp 98.6°F | Wt 157.0 lb

## 2014-02-10 DIAGNOSIS — K5732 Diverticulitis of large intestine without perforation or abscess without bleeding: Secondary | ICD-10-CM

## 2014-02-10 DIAGNOSIS — K5792 Diverticulitis of intestine, part unspecified, without perforation or abscess without bleeding: Secondary | ICD-10-CM

## 2014-02-10 NOTE — Progress Notes (Signed)
   Subjective:    Patient ID: Vanessa Barrera, female    DOB: [birth year], 58 y.o.   MRN: [id number]  HPI Patient seen with left lower quadrant abdominal pain. Emergency room followup. She had colonoscopy back in May which showed diverticulosis and 2 benign polyps. No prior history of diverticulitis. On Thursday prior to presenting to ER on Saturday she developed some left lower quadrant abdominal pain but no fevers or chills. She has some mild constipation. Labs in the ER were unremarkable. No x-rays were done. She was diagnosed with acute diverticulitis and placed on Flagyl and Cipro. At the time of ER visit she states her pain was 8 out of 10 and currently 2/10. Still has occasional constipation. No bloody stools. No fever. Overall feels much improved.  Past Medical History  Diagnosis Date  . Allergic rhinitis   . Hypertension   . Depression   . Diverticulosis   . Lump in female breast   . Postmenopausal atrophic vaginitis   . HPV in female   . Arthritis   . Degenerative disc disease, lumbar    Past Surgical History  Procedure Laterality Date  . Breast biopsies      x3  . Fractured left ankle    . Fractured left pelvic bone    . Torn ligament left knee    . Left ankle surgery    . Cb      x2    reports that she quit smoking about 23 years ago. She has never used smokeless tobacco. She reports that she drinks alcohol. She reports that she does not use illicit drugs. family history includes Alcohol abuse in her other; Cancer in her other; Diabetes in her other; Heart disease in her other; Hypertension in her other; Stroke in her other. There is no history of Colon cancer, Colon polyps, or Stomach cancer. No Known Allergies    Review of Systems  Constitutional: Negative for fever and chills.  Respiratory: Negative for shortness of breath.   Cardiovascular: Negative for chest pain.  Gastrointestinal: Positive for abdominal pain. Negative for nausea, vomiting, diarrhea, blood in  stool and abdominal distention.       Objective:   Physical Exam  Constitutional: She appears well-developed and well-nourished.  Cardiovascular: Normal rate and regular rhythm.   Pulmonary/Chest: Effort normal and breath sounds normal. No respiratory distress. She has no wheezes. She has no rales.  Abdominal: Soft. Bowel sounds are normal. She exhibits no distension and no mass. There is no rebound and no guarding.  Only very minimally tender left lower quadrant  Skin: No rash noted.          Assessment & Plan:  Abdominal pain, left lower quadrant. Suspect recent acute diverticulitis. Improving with Flagyl and Cipro. Finish out antibiotics. Dietary factors discussed. Low-residue diet until current episode improves. We discussed long-term importance of adequate fiber

## 2014-02-10 NOTE — ED Provider Notes (Signed)
Medical screening examination/treatment/procedure(s) were performed by non-physician practitioner and as supervising physician I was immediately available for consultation/collaboration.   EKG Interpretation None       Virgel Manifold, MD 02/10/14 1343

## 2014-02-10 NOTE — Patient Instructions (Signed)

## 2014-02-10 NOTE — Progress Notes (Signed)
Pre visit review using our clinic review tool, if applicable. No additional management support is needed unless otherwise documented below in the visit note. 

## 2014-02-17 ENCOUNTER — Telehealth: Payer: Self-pay | Admitting: Family Medicine

## 2014-02-17 NOTE — Telephone Encounter (Signed)
Patient Information:  Caller Name: [place]  Phone: [phone]  Patient: Vanessa Barrera, Vanessa Barrera  Gender: Female  DOB: 05/28/1956  Age: 58 Years  PCP: Carolann Littler [facility])  Office Follow Up:  Does the office need to follow up with this patient?: Yes  Instructions For The Office: Follow up care after treatment of diverticulitis  RN Note: Patient is calling regarding mild rectal bleeding without pain or discomfort. after BM today.  Blood noted only when wiping and describes as "Small amount on tissue."  Recent colonoscopy 2 months ago. Dx diverticulitis.  Has been on antibiotics and has a couple of more days left to take of medicine.  She is unsure about plan of care and diet.  Stating "I have pretty much ate my normal diet while being on antibiotics, and now I've read that I'm suppose to be on clear liquids."  Desires a call back regarding diet and plan of care.  Symptoms  Reason For Call & Symptoms: rectal bleeding  Reviewed Health History In EMR: Yes  Reviewed Medications In EMR: Yes  Reviewed Allergies In EMR: Yes  Reviewed Surgeries / Procedures: Yes  Date of Onset of Symptoms: 02/17/2014  Guideline(s) Used:  Rectal Bleeding  Disposition Per Guideline:   See Within 2 Weeks in Office  Reason For Disposition Reached:   Rectal bleeding is minimal (e.g., blood just on toilet paper, a few drops in toilet bowl)  Advice Given:  General Instructions for Treating and Preventing Constipation:   Eat a high fiber diet.  Drink adequate liquids.  High Fiber Diet:  Try to eat fresh fruit and vegetables at each meal (peas, prunes, citrus, apples, beans, corn).  Eat more grain foods (bran flakes, bran muffins, graham crackers, oatmeal, brown rice, and whole wheat bread).  Call Back If:  Bleeding increases in amount  Bleeding occurs 3 or more times after treatment begins  You become worse.  Patient Will Follow Care Advice:  YES

## 2014-02-18 ENCOUNTER — Ambulatory Visit: Payer: BC Managed Care – PPO | Admitting: Family Medicine

## 2014-03-10 ENCOUNTER — Ambulatory Visit (INDEPENDENT_AMBULATORY_CARE_PROVIDER_SITE_OTHER): Payer: BC Managed Care – PPO | Admitting: Family Medicine

## 2014-03-10 ENCOUNTER — Encounter: Payer: Self-pay | Admitting: Family Medicine

## 2014-03-10 VITALS — BP 110/70 | Temp 98.2°F | Wt 159.0 lb

## 2014-03-10 DIAGNOSIS — R1031 Right lower quadrant pain: Secondary | ICD-10-CM

## 2014-03-10 DIAGNOSIS — G8929 Other chronic pain: Secondary | ICD-10-CM

## 2014-03-10 NOTE — Patient Instructions (Signed)
Pelvic ultrasound ASAP  During the process of your evaluation if your pain gets worse,,,,,,,,,,,,,, call GI immediately,

## 2014-03-10 NOTE — Progress Notes (Signed)
   Subjective:    Patient ID: Vanessa Barrera, female    DOB: 21-May-1956, 58 y.o.   MRN: [id number]  HPI Vanessa Barrera is a 58 year old female who comes in today for evaluation of right lower quadrant abdominal pain  She tells me she had a colonoscopy 6 weeks ago which was normal except for some diverticuli. 3 weeks after the colonoscopy she developed abdominal pain and went to the emergency room and was diagnosed with diverticulitis. She was given Cipro and Flagyl which he took for 10 days her symptoms resolved. A day or 2 after she finished her medicine she began having right lower corner abdominal pain. She describes the pain as dull it comes and goes sometimes last for a day or 2. The pain varies from a 4-7. She has no fever chills nausea vomiting diarrhea or urinary tract symptoms. No history of trauma  Review of the colonoscopy from July shows some diminutive polyps diverticulosis on the left side but not on the right side.   Review of Systems    review of systems otherwise negative Objective:   Physical Exam  Well-developed and nourished female no acute distress  The abdomen is flat bowel sounds normal liver spleen kidneys not enlarged appreciate no masses. This and tenderness in right lower quadrant no rebound  Pelvic examination external genitalia within normal limits vaginal vault was normal. Bimanual exam shows some tenderness in the right adnexal area.......... no palpable masses. Rectal normal stool guaiac-negative      Assessment & Plan:  Right lower quadrant abdominal pain for 3 weeks etiology unknown begin workup....... start with ultrasound

## 2014-03-11 ENCOUNTER — Ambulatory Visit: Payer: BC Managed Care – PPO | Admitting: Family Medicine

## 2014-03-11 ENCOUNTER — Encounter (INDEPENDENT_AMBULATORY_CARE_PROVIDER_SITE_OTHER): Payer: Self-pay

## 2014-03-11 ENCOUNTER — Other Ambulatory Visit: Payer: Self-pay | Admitting: Family Medicine

## 2014-03-11 ENCOUNTER — Ambulatory Visit
Admission: RE | Admit: 2014-03-11 | Discharge: 2014-03-11 | Disposition: A | Discharge status: Home / Self Care | Payer: BC Managed Care – PPO | Source: Ambulatory Visit | Attending: Family Medicine | Admitting: Family Medicine

## 2014-03-11 DIAGNOSIS — G8929 Other chronic pain: Secondary | ICD-10-CM

## 2014-03-11 DIAGNOSIS — R1031 Right lower quadrant pain: Principal | ICD-10-CM

## 2014-03-12 ENCOUNTER — Telehealth: Payer: Self-pay | Admitting: Family Medicine

## 2014-03-12 NOTE — Telephone Encounter (Signed)
Pt returning your call . pls cb °

## 2014-03-15 NOTE — Telephone Encounter (Signed)
Left message on machine for patient to call GI

## 2014-03-23 ENCOUNTER — Telehealth: Payer: Self-pay | Admitting: Internal Medicine

## 2014-03-23 NOTE — Telephone Encounter (Signed)
Pt states that she has been having right sided abdominal pain. States she saw her GYN and no cause was found by GYN for pain. Pt also states she has been having black stools. Pt scheduled to see Amy Esterwood tomorrow at 8:30am. Pt aware of appt.

## 2014-03-24 ENCOUNTER — Ambulatory Visit (INDEPENDENT_AMBULATORY_CARE_PROVIDER_SITE_OTHER): Payer: BC Managed Care – PPO | Admitting: Physician Assistant

## 2014-03-24 ENCOUNTER — Other Ambulatory Visit (INDEPENDENT_AMBULATORY_CARE_PROVIDER_SITE_OTHER): Payer: BC Managed Care – PPO

## 2014-03-24 ENCOUNTER — Encounter: Payer: Self-pay | Admitting: Physician Assistant

## 2014-03-24 VITALS — BP 112/66 | HR 78 | Ht 65.0 in | Wt 157.6 lb

## 2014-03-24 DIAGNOSIS — R1031 Right lower quadrant pain: Secondary | ICD-10-CM

## 2014-03-24 LAB — CBC WITH DIFFERENTIAL/PLATELET
BASOS ABS: 0 10*3/uL (ref 0.0–0.1)
BASOS PCT: 0.4 % (ref 0.0–3.0)
EOS ABS: 0.1 10*3/uL (ref 0.0–0.7)
Eosinophils Relative: 3.1 % (ref 0.0–5.0)
HCT: 39.1 % (ref 36.0–46.0)
HEMOGLOBIN: 12.9 g/dL (ref 12.0–15.0)
LYMPHS ABS: 1.3 10*3/uL (ref 0.7–4.0)
Lymphocytes Relative: 42.7 % (ref 12.0–46.0)
MCHC: 33 g/dL (ref 30.0–36.0)
MCV: 83.8 fl (ref 78.0–100.0)
MONO ABS: 0.3 10*3/uL (ref 0.1–1.0)
Monocytes Relative: 8.8 % (ref 3.0–12.0)
Neutro Abs: 1.4 10*3/uL (ref 1.4–7.7)
Neutrophils Relative %: 45 % (ref 43.0–77.0)
Platelets: 233 10*3/uL (ref 150.0–400.0)
RBC: 4.66 Mil/uL (ref 3.87–5.11)
RDW: 15.7 % — AB (ref 11.5–15.5)
WBC: 3.2 10*3/uL — ABNORMAL LOW (ref 4.0–10.5)

## 2014-03-24 NOTE — Progress Notes (Signed)
Subjective:    Patient ID: Vanessa Barrera, female    DOB: Nov 21, 1955, 58 y.o.   MRN: [id number]  HPI  Vanessa Barrera is a pleasant 58 H. or old (female known to Dr. Henrene Pastor. She had undergone colonoscopy in May of 2015 because of some scant rectal bleeding. She was found to have 2 diminutive polyps one of which was a small tubular adenoma and moderate diverticulosis of the left colon. She says she had an episode of left lower quadrant pain in July was seen  in the emergency room and diagnosed with diverticulitis. She was treated and her symptoms improved. She is now been having problems with right lower quadrant pain. She had a pelvic ultrasound done mid August and this was unremarkable ovaries were not seen. She says she has had pelvic pain fairly chronically for some time however the right lower quadrant pain is new. Over the past week she has also had some intermittent fevers, one fever to 101.8 over the weekend and a temp 2 days ago of 100. She says her pain kind of waxes and wanes in his story and achy in nature sometimes radiates anteriorly into her leg. She has noted that her stools have been dark but she's not having any diarrhea. On further questioning she had been taking Pepto-Bismol. She feels that her pain is no different with bowel movements nor with by mouth intake. Denies any urinary symptoms and no prior history of ureteral stones.    Review of Systems  Constitutional: Positive for fever, appetite change and unexpected weight change.  HENT: Negative.   Eyes: Negative.   Respiratory: Negative.   Cardiovascular: Negative.   Gastrointestinal: Positive for abdominal pain.  Endocrine: Negative.   Genitourinary: Negative.   Musculoskeletal: Negative.   Skin: Negative.   Allergic/Immunologic: Negative.   Neurological: Negative.   Hematological: Negative.   Psychiatric/Behavioral: Negative.    Outpatient Prescriptions Prior to Visit  Medication Sig Dispense Refill  . acyclovir (ZOVIRAX)  400 MG tablet 1 tablet every morning as needed      . calcium carbonate (TUMS - DOSED IN MG ELEMENTAL CALCIUM) 500 MG chewable tablet Chew 2 tablets by mouth daily.      . Multiple Vitamin (MULTIVITAMIN WITH MINERALS) TABS tablet Take 1 tablet by mouth daily.       No facility-administered medications prior to visit.      No Known Allergies Patient Active Problem List   Diagnosis Date Noted  . Abdominal pain, chronic, right lower quadrant 03/10/2014  . Viral URI 06/23/2013  . Urticaria 09/13/2011  . HPV in female 09/13/2011  . BREAST MASS, LEFT 04/21/2009  . POSTMENOPAUSAL ATROPHIC VAGINITIS 10/17/2007  . ALLERGIC RHINITIS 03/25/2007   History  Substance Use Topics  . Smoking status: Former Smoker    Quit date: 10/31/1990  . Smokeless tobacco: Never Used  . Alcohol Use: Yes     Comment: rare   family history includes Alcohol abuse in her other; Cancer in her other; Diabetes in her other; Heart disease in her other; Hypertension in her other; Stroke in her other. There is no history of Colon cancer, Colon polyps, or Stomach cancer.  Objective:   Physical Exam well-developed African American female in no acute distress, pleasant blood pressure 112/66 pulse 78 height 5 foot 5 weight 157. HEENT; nontraumatic normocephalic EOMI PERRLA sclera anicteric, Supple; no JVD, Cardiovascular; regular rate and rhythm with S1-S2 no murmur or gallop, Pulmonary; clear bilaterally, Abdomen ;soft she is tender in the right lower  quadrant with deep palpation there is no guarding or rebound no palpable mass or hepatosplenomegaly bowel sounds are present, Rectal ;exam not done, Extremities; no clubbing cyanosis or edema skin warm dry, Psych ;mood and affect appropriate        Assessment & Plan:  #62  58 year old female with 2 week history of right lower quadrant pain and now intermittent fevers. Etiology not clear. Rule out mesenteric adenitis, epiploic appendagitis , ureterolithiasis or other  intra-abdominal inflammatory process #2 history of sigmoid diverticulosis and an episode of diverticulitis July 2015 #3 tubular adenomatous polyp on colonoscopy May 2015 will need followup in 5 years #4 chronic pelvic pain  Plan; CBC with differential Schedule for CT scan of the abdomen and pelvis with contrast. Further plans pending results of above.

## 2014-03-24 NOTE — Patient Instructions (Signed)
Please go to the basement level to have your labs drawn.   You have been scheduled for a CT scan of the abdomen and pelvis at [facility] ([address]---this is in the same building as Press [facility]).   You are scheduled on 03-25-2014 at 8:30 am . You should arrive  At 8:15 am  to your appointment time for registration. Please follow the written instructions below on the day of your exam:  WARNING: IF YOU ARE ALLERGIC TO IODINE/X-RAY DYE, PLEASE NOTIFY RADIOLOGY IMMEDIATELY AT [phone]! YOU WILL BE GIVEN A 13 HOUR PREMEDICATION PREP.  1) Do not eat or drink anything after 4:30 am  (4 hours prior to your test) 2) You have been given 2 bottles of oral contrast to drink. The solution may taste better if refrigerated, but do NOT add ice or any other liquid to this solution. Shake  well before drinking.    Drink 1 bottle of contrast @ 6:30  (2 hours prior to your exam)  Drink 1 bottle of contrast @ 7:30 am  (1 hour prior to your exam)  You may take any medications as prescribed with a small amount of water except for the following: Metformin, Glucophage, Glucovance, Avandamet, Riomet, Fortamet, Actoplus Met, Janumet, Glumetza or Metaglip. The above medications must be held the day of the exam AND 48 hours after the exam.  The purpose of you drinking the oral contrast is to aid in the visualization of your intestinal tract. The contrast solution may cause some diarrhea. Before your exam is started, you will be given a small amount of fluid to drink. Depending on your individual set of symptoms, you may also receive an intravenous injection of x-ray contrast/dye. Plan on being at [facility] for 30 minutes or long, depending on the type of exam you are having performed.  If you have any questions regarding your exam or if you need to reschedule, you may call the CT department at [phone] between the hours of 8:00 am and 5:00 pm,  Monday-Friday.  ________________________________________________________________________

## 2014-03-24 NOTE — Progress Notes (Signed)
Agree with initial assessment and plans 

## 2014-03-25 ENCOUNTER — Ambulatory Visit (INDEPENDENT_AMBULATORY_CARE_PROVIDER_SITE_OTHER)
Admission: RE | Admit: 2014-03-25 | Discharge: 2014-03-25 | Disposition: A | Discharge status: Home / Self Care | Payer: BC Managed Care – PPO | Source: Ambulatory Visit | Attending: Physician Assistant | Admitting: Physician Assistant

## 2014-03-25 DIAGNOSIS — R1031 Right lower quadrant pain: Secondary | ICD-10-CM

## 2014-03-25 MED ORDER — IOHEXOL 300 MG/ML  SOLN
100.0000 mL | Freq: Once | INTRAMUSCULAR | Status: AC | PRN
  Administered 2014-03-25: 100 mL via INTRAVENOUS

## 2014-07-27 ENCOUNTER — Emergency Department (HOSPITAL_COMMUNITY)
Admission: EM | Admit: 2014-07-27 | Discharge: 2014-07-27 | Disposition: A | Discharge status: Home / Self Care | Payer: BLUE CROSS/BLUE SHIELD | Attending: Emergency Medicine | Admitting: Emergency Medicine

## 2014-07-27 ENCOUNTER — Telehealth: Payer: Self-pay | Admitting: Family Medicine

## 2014-07-27 DIAGNOSIS — Z87891 Personal history of nicotine dependence: Secondary | ICD-10-CM | POA: Diagnosis not present

## 2014-07-27 DIAGNOSIS — R55 Syncope and collapse: Secondary | ICD-10-CM | POA: Insufficient documentation

## 2014-07-27 DIAGNOSIS — R079 Chest pain, unspecified: Secondary | ICD-10-CM | POA: Insufficient documentation

## 2014-07-27 DIAGNOSIS — Z8619 Personal history of other infectious and parasitic diseases: Secondary | ICD-10-CM | POA: Insufficient documentation

## 2014-07-27 DIAGNOSIS — M1990 Unspecified osteoarthritis, unspecified site: Secondary | ICD-10-CM | POA: Insufficient documentation

## 2014-07-27 DIAGNOSIS — Z79899 Other long term (current) drug therapy: Secondary | ICD-10-CM | POA: Diagnosis not present

## 2014-07-27 DIAGNOSIS — I10 Essential (primary) hypertension: Secondary | ICD-10-CM | POA: Insufficient documentation

## 2014-07-27 DIAGNOSIS — Z8659 Personal history of other mental and behavioral disorders: Secondary | ICD-10-CM | POA: Diagnosis not present

## 2014-07-27 DIAGNOSIS — Z8719 Personal history of other diseases of the digestive system: Secondary | ICD-10-CM | POA: Insufficient documentation

## 2014-07-27 DIAGNOSIS — Z8742 Personal history of other diseases of the female genital tract: Secondary | ICD-10-CM | POA: Insufficient documentation

## 2014-07-27 LAB — CBC
HCT: 36.5 % (ref 36.0–46.0)
Hemoglobin: 12.2 g/dL (ref 12.0–15.0)
MCH: 27.7 pg (ref 26.0–34.0)
MCHC: 33.4 g/dL (ref 30.0–36.0)
MCV: 83 fL (ref 78.0–100.0)
Platelets: 213 10*3/uL (ref 150–400)
RBC: 4.4 MIL/uL (ref 3.87–5.11)
RDW: 14.4 % (ref 11.5–15.5)
WBC: 4.1 10*3/uL (ref 4.0–10.5)

## 2014-07-27 LAB — BASIC METABOLIC PANEL
Anion gap: 13 (ref 5–15)
BUN: 10 mg/dL (ref 6–23)
CO2: 20 mmol/L (ref 19–32)
CREATININE: 0.79 mg/dL (ref 0.50–1.10)
Calcium: 9.1 mg/dL (ref 8.4–10.5)
Chloride: 108 mEq/L (ref 96–112)
GFR calc non Af Amer: >90 mL/min (ref >90)
GLUCOSE: 91 mg/dL (ref 70–99)
Potassium: 4 mmol/L (ref 3.5–5.1)
Sodium: 141 mmol/L (ref 135–145)

## 2014-07-27 LAB — I-STAT TROPONIN, ED: Troponin i, poc: 0.01 ng/mL (ref 0.00–0.08)

## 2014-07-27 NOTE — ED Notes (Signed)
X 2 black outs at work an ~ 1hr. Ago. 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 Some cp last night; states, "now having lt. Lateral lower cp." taking bayer asa.

## 2014-07-27 NOTE — Discharge Instructions (Signed)
Tests were good. Increase fluids. Eat regular small meals including more protein. Follow-up your primary care doctor.

## 2014-07-27 NOTE — Telephone Encounter (Signed)
Patient Name: Vanessa Barrera  DOB: June 26, 1956    Nurse Assessment  Nurse: Marcelline Deist, RN, Lynda Date/Time (Eastern Time): 07/27/2014 12:59:13 PM  Confirm and document reason for call. If symptomatic, describe symptoms. ---Caller states she is having sudden black- out episodes, did not completely lose consciousness. Has a cold, taking Mega 3 1000 mg 3 days ago. Has had 2 episodes today within 20 mins. of each other.  Has the patient traveled out of the country within the last 30 days? ---Not Applicable  Does the patient require triage? ---Yes  Related visit to physician within the last 2 weeks? ---No  Does the PT have any chronic conditions? (i.e. diabetes, asthma, etc.) ---No     Guidelines    Guideline Title Affirmed Question Affirmed Notes  Fainting Age > 50 years (Exception: occurred > 1 hour ago AND now feels completely fine)    Final Disposition User   Call EMS 911 Now Marcelline Deist, RN, Kermit Balo    Comments  Caller will have someone take her to [facility] instead of calling EMS/911.

## 2014-07-27 NOTE — ED Provider Notes (Signed)
CSN: [id number]     Arrival date & time 07/27/14  1351 History   First MD Initiated Contact with Patient 07/27/14 1440     Chief Complaint  Patient presents with  . Near Syncope  . Chest Pain     (Consider location/radiation/quality/duration/timing/severity/associated sxs/prior Treatment) HPI.... Patient stands for excessive length of time at work. Today she felt like she might black out approximately 1 hour prior to admission. 2 brief episodes of chest pain past couple days lasting 1 minute.   Past medical history hypertension. Nonsmoker. Father had CABG at age 13. No dyspnea, diaphoresis, nausea, neurological deficits. Severity is mild to moderate. She ate a small bowl of cereal for breakfast  Past Medical History  Diagnosis Date  . Allergic rhinitis   . Hypertension   . Depression   . Diverticulosis   . Lump in female breast   . Postmenopausal atrophic vaginitis   . HPV in female   . Arthritis   . Degenerative disc disease, lumbar    Past Surgical History  Procedure Laterality Date  . Breast biopsies      x3  . Fractured left ankle    . Fractured left pelvic bone    . Torn ligament left knee    . Left ankle surgery    . Cb      x2   Family History  Problem Relation Age of Onset  . Alcohol abuse Other   . Cancer Other     breast  . Heart disease Other   . Diabetes Other   . Hypertension Other   . Stroke Other   . Colon cancer Neg Hx   . Colon polyps Neg Hx   . Stomach cancer Neg Hx    History  Substance Use Topics  . Smoking status: Former Smoker    Quit date: 10/31/1990  . Smokeless tobacco: Never Used  . Alcohol Use: Yes     Comment: rare   OB History    No data available     Review of Systems  All other systems reviewed and are negative.     Allergies  Review of patient's allergies indicates no known allergies.  Home Medications   Prior to Admission medications   Medication Sig Start Date End Date Taking? Authorizing Provider  acyclovir  (ZOVIRAX) 400 MG tablet 1 tablet every morning as needed 08/13/13  Yes Dorena Cookey, MD  calcium carbonate (TUMS - DOSED IN MG ELEMENTAL CALCIUM) 500 MG chewable tablet Chew 2 tablets by mouth daily.   Yes Historical Provider, MD  ibuprofen (ADVIL,MOTRIN) 400 MG tablet Take 400 mg by mouth every 6 (six) hours as needed for fever or mild pain.   Yes Historical Provider, MD  Omega 3 1000 MG CAPS Take 1 capsule by mouth daily.   Yes Historical Provider, MD   BP 136/81 mmHg  Pulse 57  Temp(Src) 97.7 F (36.5 C) (Oral)  Resp 18  SpO2 99% Physical Exam  Constitutional: She is oriented to person, place, and time. She appears well-developed and well-nourished.  HENT:  Head: Normocephalic and atraumatic.  Eyes: Conjunctivae and EOM are normal. Pupils are equal, round, and reactive to light.  Neck: Normal range of motion. Neck supple.  Cardiovascular: Normal rate and regular rhythm.   Pulmonary/Chest: Effort normal and breath sounds normal.  Abdominal: Soft. Bowel sounds are normal.  Musculoskeletal: Normal range of motion.  Neurological: She is alert and oriented to person, place, and time.  Skin: Skin is warm and  dry.  Psychiatric: She has a normal mood and affect. Her behavior is normal.  Nursing note and vitals reviewed.   ED Course  Procedures (including critical care time) Labs Review Labs Reviewed  [facility], ED    Imaging Review No results found.   EKG Interpretation   Date/Time:  Tuesday July 27 2014 14:28:24 EST Ventricular Rate:  61 PR Interval:  172 QRS Duration: 78 QT Interval:  394 QTC Calculation: 396 R Axis:   58 Text Interpretation:  Undetermined rhythm Otherwise normal ECG Confirmed  by Charnice Zwilling  MD, Erendida Wrenn ([id number]) on 07/27/2014 4:18:53 PM      MDM   Final diagnoses:  Pre-syncope    Normal physical exam. Patient is alert and oriented 3 and normal acting and appearing. Screening labs, troponin, EKG, all normal.  She  will follow-up with her primary care doctor.   Nat Christen, MD 07/27/14 [phone]

## 2014-07-27 NOTE — Telephone Encounter (Signed)
Agreed and noted by Dr Sherren Mocha

## 2014-08-17 ENCOUNTER — Other Ambulatory Visit: Payer: Self-pay | Admitting: Family Medicine

## 2014-08-24 ENCOUNTER — Ambulatory Visit: Payer: BLUE CROSS/BLUE SHIELD | Admitting: Family Medicine

## 2014-09-03 ENCOUNTER — Ambulatory Visit (INDEPENDENT_AMBULATORY_CARE_PROVIDER_SITE_OTHER): Payer: BLUE CROSS/BLUE SHIELD | Admitting: Internal Medicine

## 2014-09-03 ENCOUNTER — Encounter: Payer: Self-pay | Admitting: Internal Medicine

## 2014-09-03 VITALS — BP 130/86 | Temp 98.0°F | Wt 166.0 lb

## 2014-09-03 DIAGNOSIS — J069 Acute upper respiratory infection, unspecified: Secondary | ICD-10-CM

## 2014-09-03 DIAGNOSIS — H9202 Otalgia, left ear: Secondary | ICD-10-CM

## 2014-09-03 MED ORDER — OFLOXACIN 0.3 % OT SOLN: 10.0000 [drp] | Freq: Every day | OTIC | Status: DC

## 2014-09-03 NOTE — Patient Instructions (Addendum)
Do not use q tips oin ear   There is a piece of wax in canal deeper  than usual . Some redness but otherwise ok. Use the ear drops every day  And if not getting better in the next  5-7 days return  To have dr Sherren Mocha check ear or we can have ENT to see your ear.   Chest is clear  On exam today .

## 2014-09-03 NOTE — Progress Notes (Signed)
Pre visit review using our clinic review tool, if applicable. No additional management support is needed unless otherwise documented below in the visit note.  Chief Complaint  Patient presents with  . Left Ear Pain    X2-3Days    HPI: Patient Vanessa Barrera  comes in today for SDA for  new problem evaluation. PCP NA Has had a resp infection  Getting better  Clogged with  Cold and used q tip ears  Left began hursting bad and now sore to touch and  Now hurting wporse.  Chest full of cold no fever  Hearing "ok" no face pain .  ROS: See pertinent positives and negatives per HPI. No cp sob hemoptysis   Past Medical History  Diagnosis Date  . Allergic rhinitis   . Hypertension   . Depression   . Diverticulosis   . Lump in female breast   . Postmenopausal atrophic vaginitis   . HPV in female   . Arthritis   . Degenerative disc disease, lumbar     Family History  Problem Relation Age of Onset  . Alcohol abuse Other   . Cancer Other     breast  . Heart disease Other   . Diabetes Other   . Hypertension Other   . Stroke Other   . Colon cancer Neg Hx   . Colon polyps Neg Hx   . Stomach cancer Neg Hx     History   Social History  . Marital Status: Married    Spouse Name: N/A  . Number of Children: 2  . Years of Education: N/A   Occupational History  . Retail    Social History Main Topics  . Smoking status: Former Smoker    Quit date: 10/31/1990  . Smokeless tobacco: Never Used  . Alcohol Use: Yes     Comment: rare  . Drug Use: No  . Sexual Activity: Not on file   Other Topics Concern  . None   Social History Narrative    Outpatient Encounter Prescriptions as of 09/03/2014  Medication Sig  . acyclovir (ZOVIRAX) 400 MG tablet TAKE 1 TABLET BY MOUTH EVERY MORNING  . calcium carbonate (TUMS - DOSED IN MG ELEMENTAL CALCIUM) 500 MG chewable tablet Chew 2 tablets by mouth daily.  Marland Kitchen ibuprofen (ADVIL,MOTRIN) 400 MG tablet Take 400 mg by mouth every 6 (six) hours as  needed for fever or mild pain.  . Omega 3 1000 MG CAPS Take 1 capsule by mouth daily.  Marland Kitchen ofloxacin (FLOXIN) 0.3 % otic solution Place 10 drops into the left ear daily. For 7 days or as directed  . [DISCONTINUED] acyclovir (ZOVIRAX) 400 MG tablet 1 tablet every morning as needed    EXAM:  BP 130/86 mmHg  Temp(Src) 98 F (36.7 C) (Oral)  Wt 166 lb (75.297 kg)  Body mass index is 27.62 kg/(m^2).  GENERAL: vitals reviewed and listed above, alert, oriented, appears well hydrated and in no acute distress congested noted  HEENT: atraumatic, conjunctiva  clear, no obvious abnormalities on inspection of external nose and ears nares clogged face non tender  Right eac min wax tm clear  Nl lm left mild tenderness tragal opull midl eac repink 1+ wax deep inferior canal superior tm grey  OP : no lesion edema or exudate  NECK: no obvious masses on inspection palpation no adenopathy  LUNGS: clear to auscultation bilaterally, no wheezes, rales or rhonchi, good air movement CV: HRRR, no clubbing cyanosis or  peripheral edema nl cap  refill  MS: moves all extremities without noticeable focal  abnormality PSYCH: pleasant and cooperative, no obvious depression or anxiety  ASSESSMENT AND PLAN:  Discussed the following assessment and plan:  Left ear pain  URI, acute - resolving Onset after q top to perf seen and no blood  rx with antibiotic ear drop   If no better see ent or dr Sherren Mocha    -Patient advised to return or notify health care team  if symptoms worsen ,persist or new concerns arise.  Patient Instructions  Do not use q tips oin ear   There is a piece of wax in canal deeper  than usual . Some redness but otherwise ok. Use the ear drops every day  And if not getting better in the next  5-7 days return  To have dr Sherren Mocha check ear or we can have ENT to see your ear.   Chest is clear  On exam today .   Standley Brooking. Garima Chronis M.D.

## 2014-12-14 ENCOUNTER — Other Ambulatory Visit: Payer: Self-pay

## 2014-12-14 DIAGNOSIS — Z1231 Encounter for screening mammogram for malignant neoplasm of breast: Secondary | ICD-10-CM

## 2015-02-10 ENCOUNTER — Ambulatory Visit
Admission: RE | Admit: 2015-02-10 | Discharge: 2015-02-10 | Disposition: A | Discharge status: Home / Self Care | Payer: BLUE CROSS/BLUE SHIELD | Source: Ambulatory Visit

## 2015-02-10 DIAGNOSIS — Z1231 Encounter for screening mammogram for malignant neoplasm of breast: Secondary | ICD-10-CM

## 2015-05-31 ENCOUNTER — Telehealth: Payer: Self-pay | Admitting: Family Medicine

## 2015-05-31 NOTE — Telephone Encounter (Signed)
Pt having pain in her shoulders, back, down through her left hip.  Trying to get back in town to see if Dr Sherren Mocha could possibly see her wed, tomorrow.  Pt refused to see another provider. Please advise if ok to schedule.

## 2015-05-31 NOTE — Telephone Encounter (Signed)
Left message on machine for patient to go to ortho per Dr Sherren Mocha

## 2015-06-02 ENCOUNTER — Ambulatory Visit: Payer: BLUE CROSS/BLUE SHIELD | Admitting: Family Medicine

## 2015-08-24 ENCOUNTER — Other Ambulatory Visit (INDEPENDENT_AMBULATORY_CARE_PROVIDER_SITE_OTHER): Payer: BLUE CROSS/BLUE SHIELD

## 2015-08-24 DIAGNOSIS — Z0000 Encounter for general adult medical examination without abnormal findings: Secondary | ICD-10-CM | POA: Diagnosis not present

## 2015-08-24 LAB — CBC WITH DIFFERENTIAL/PLATELET
Basophils Absolute: 0 10*3/uL (ref 0.0–0.1)
Basophils Relative: 0.4 % (ref 0.0–3.0)
EOS PCT: 2.9 % (ref 0.0–5.0)
Eosinophils Absolute: 0.1 10*3/uL (ref 0.0–0.7)
HCT: 40.3 % (ref 36.0–46.0)
Hemoglobin: 13.1 g/dL (ref 12.0–15.0)
LYMPHS ABS: 1.4 10*3/uL (ref 0.7–4.0)
Lymphocytes Relative: 39.1 % (ref 12.0–46.0)
MCHC: 32.6 g/dL (ref 30.0–36.0)
MCV: 82.7 fl (ref 78.0–100.0)
MONO ABS: 0.3 10*3/uL (ref 0.1–1.0)
MONOS PCT: 9.2 % (ref 3.0–12.0)
NEUTROS PCT: 48.4 % (ref 43.0–77.0)
Neutro Abs: 1.8 10*3/uL (ref 1.4–7.7)
PLATELETS: 235 10*3/uL (ref 150.0–400.0)
RBC: 4.87 Mil/uL (ref 3.87–5.11)
RDW: 15.3 % (ref 11.5–15.5)
WBC: 3.7 10*3/uL — ABNORMAL LOW (ref 4.0–10.5)

## 2015-08-24 LAB — TSH: TSH: 0.54 u[IU]/mL (ref 0.35–4.50)

## 2015-08-24 LAB — POC URINALSYSI DIPSTICK (AUTOMATED)
Bilirubin, UA: NEGATIVE
GLUCOSE UA: NEGATIVE
Ketones, UA: NEGATIVE
Leukocytes, UA: NEGATIVE
NITRITE UA: NEGATIVE
PROTEIN UA: NEGATIVE
SPEC GRAV UA: 1.025
UROBILINOGEN UA: 0.2
pH, UA: 5

## 2015-08-24 LAB — HEPATIC FUNCTION PANEL
ALBUMIN: 4.2 g/dL (ref 3.5–5.2)
ALT: 14 U/L (ref 0–35)
AST: 17 U/L (ref 0–37)
Alkaline Phosphatase: 62 U/L (ref 39–117)
BILIRUBIN TOTAL: 0.6 mg/dL (ref 0.2–1.2)
Bilirubin, Direct: 0.1 mg/dL (ref 0.0–0.3)
Total Protein: 6.6 g/dL (ref 6.0–8.3)

## 2015-08-24 LAB — LIPID PANEL
CHOLESTEROL: 177 mg/dL (ref 0–200)
HDL: 58.8 mg/dL (ref >39.00)
LDL Cholesterol: 98 mg/dL (ref 0–99)
NonHDL: 117.88
TRIGLYCERIDES: 98 mg/dL (ref 0.0–149.0)
Total CHOL/HDL Ratio: 3
VLDL: 19.6 mg/dL (ref 0.0–40.0)

## 2015-08-24 LAB — BASIC METABOLIC PANEL
BUN: 15 mg/dL (ref 6–23)
CHLORIDE: 109 meq/L (ref 96–112)
CO2: 23 mEq/L (ref 19–32)
CREATININE: 0.87 mg/dL (ref 0.40–1.20)
Calcium: 9.5 mg/dL (ref 8.4–10.5)
GFR: 85.58 mL/min (ref >60.00)
GLUCOSE: 107 mg/dL — AB (ref 70–99)
Potassium: 4.1 mEq/L (ref 3.5–5.1)
SODIUM: 141 meq/L (ref 135–145)

## 2015-08-25 LAB — HEPATITIS C ANTIBODY: HCV Ab: NEGATIVE

## 2015-09-05 ENCOUNTER — Other Ambulatory Visit: Payer: BLUE CROSS/BLUE SHIELD

## 2015-09-12 ENCOUNTER — Encounter: Payer: Self-pay | Admitting: Family Medicine

## 2015-09-12 ENCOUNTER — Ambulatory Visit (INDEPENDENT_AMBULATORY_CARE_PROVIDER_SITE_OTHER): Payer: BLUE CROSS/BLUE SHIELD | Admitting: Family Medicine

## 2015-09-12 VITALS — BP 110/80 | Temp 97.8°F | Ht 63.0 in | Wt 165.0 lb

## 2015-09-12 DIAGNOSIS — Z0000 Encounter for general adult medical examination without abnormal findings: Secondary | ICD-10-CM | POA: Diagnosis not present

## 2015-09-12 MED ORDER — ACYCLOVIR 400 MG PO TABS: 400.0000 mg | ORAL_TABLET | Freq: Every morning | ORAL | Status: AC

## 2015-09-12 NOTE — Progress Notes (Signed)
Pre visit review using our clinic review tool, if applicable. No additional management support is needed unless otherwise documented below in the visit note. 

## 2015-09-12 NOTE — Patient Instructions (Addendum)
Begin a diet and exercise program.......Marland Kitchen eliminate all carbohydrates from your diet............ no sugar......... walk 30 minutes daily  Return in one year for general physical exam sooner if any problems,,,,,,,,, Tommi Rumps or Almyra Free are 2 new adult nurse practitioners or Dr. Martinique  Call Dr. Delman Cheadle......... to get set up for screening eye exam

## 2015-09-12 NOTE — Progress Notes (Signed)
   Subjective:    Patient ID: Vanessa Barrera, female    DOB: 1956-04-26, 60 y.o.   MRN: [id number]  HPI Vanessa Barrera is a 60 year old married female nonsmoker who comes in today for general physical examination  She does not get routine eye care. Recommend Dr. Delman Cheadle for screening for glaucoma, regular dental care, she does not do BSE monthly but does get a mammogram once a year. Colonoscopy 2015 was normal  She's been watching television and wants a screening test for hepatitis C although she has no risk factors of course her hepatitis C is negative  LMP 9 years ago normal. Last Pap was 2 years ago normal no GYN symptoms therefore pelvic and Pap every 3 years  Tetanus booster 2012 declines a flu shot   Review of Systems  Constitutional: Negative.   HENT: Negative.   Eyes: Negative.   Respiratory: Negative.   Cardiovascular: Negative.   Gastrointestinal: Negative.   Endocrine: Negative.   Genitourinary: Negative.   Musculoskeletal: Negative.   Skin: Negative.   Allergic/Immunologic: Negative.   Neurological: Negative.   Hematological: Negative.   Psychiatric/Behavioral: Negative.        Objective:   Physical Exam  Constitutional: She appears well-developed and well-nourished.  HENT:  Head: Normocephalic and atraumatic.  Right Ear: External ear normal.  Left Ear: External ear normal.  Nose: Nose normal.  Mouth/Throat: Oropharynx is clear and moist.  Eyes: EOM are normal. Pupils are equal, round, and reactive to light.  Neck: Normal range of motion. Neck supple. No JVD present. No tracheal deviation present. No thyromegaly present.  Cardiovascular: Normal rate, regular rhythm, normal heart sounds and intact distal pulses.  Exam reveals no gallop and no friction rub.   No murmur heard. Pulmonary/Chest: Effort normal and breath sounds normal. No stridor. No respiratory distress. She has no wheezes. She has no rales. She exhibits no tenderness.  Abdominal: Soft. Bowel sounds are  normal. She exhibits no distension and no mass. There is no tenderness. There is no rebound and no guarding.  Genitourinary:  Bilateral breast exam normal  Musculoskeletal: Normal range of motion.  Lymphadenopathy:    She has no cervical adenopathy.  Neurological: She is alert. She has normal reflexes. No cranial nerve deficit. She exhibits normal muscle tone. Coordination normal.  Skin: Skin is warm and dry. No rash noted. No erythema. No pallor.  Psychiatric: She has a normal mood and affect. Her behavior is normal. Judgment and thought content normal.  Nursing note and vitals reviewed.         Assessment & Plan:  Healthy female  Overweight prediabetes with a BMI of 27.62........ as we have in the past recommended diet exercise and weight loss  History of HSV-1 refill Zovirax which she'll he takes 400 mg daily when necessary when she has a flare.

## 2016-05-15 ENCOUNTER — Other Ambulatory Visit (HOSPITAL_COMMUNITY): Payer: Self-pay | Admitting: *Deleted

## 2016-05-15 DIAGNOSIS — N644 Mastodynia: Secondary | ICD-10-CM

## 2016-05-31 ENCOUNTER — Ambulatory Visit (HOSPITAL_COMMUNITY)
Admission: RE | Admit: 2016-05-31 | Discharge: 2016-05-31 | Disposition: A | Discharge status: Home / Self Care | Payer: BLUE CROSS/BLUE SHIELD | Source: Ambulatory Visit | Attending: Obstetrics and Gynecology | Admitting: Obstetrics and Gynecology

## 2016-05-31 ENCOUNTER — Ambulatory Visit
Admission: RE | Admit: 2016-05-31 | Discharge: 2016-05-31 | Disposition: A | Discharge status: Home / Self Care | Payer: No Typology Code available for payment source | Source: Ambulatory Visit | Attending: Obstetrics and Gynecology | Admitting: Obstetrics and Gynecology

## 2016-05-31 ENCOUNTER — Encounter (HOSPITAL_COMMUNITY): Payer: Self-pay

## 2016-05-31 VITALS — BP 140/86 | Temp 98.3°F | Ht 63.0 in | Wt 163.4 lb

## 2016-05-31 DIAGNOSIS — N644 Mastodynia: Secondary | ICD-10-CM

## 2016-05-31 DIAGNOSIS — Z1239 Encounter for other screening for malignant neoplasm of breast: Secondary | ICD-10-CM

## 2016-05-31 NOTE — Patient Instructions (Addendum)
Explained breast self awareness to [employer]. Patient did not need a Pap smear today due to last Pap smear was 08/13/2013. Let her know [facility] will cover Pap smears every 3 years unless has a history of abnormal Pap smears. Reminded patient that her next Pap smear is due at the end of January. Patient has scheduled an appointment with [facility] on 08/16/2016 at 0900 for her Pap smear. Referred patient to the [facility] for diagnostic mammogram and possible left breast ultrasound. Appointment scheduled for Thursday, May 31, 2016 at 1010. Vanessa Barrera verbalized understanding.  [name], Arvil Chaco, RN 10:08 AM

## 2016-05-31 NOTE — Progress Notes (Signed)
Complaints of left breast pain x 2-3 months that was constant until 2-3 weeks ago. Patient states the pain comes and goes. Patient rates pain at a 3-4 out of 10 and stated is was a 7-8 out of 10. Patient complained that has had some right breast soreness underneath breast that started a month ago due to heavy lifting.  Pap Smear:  Pap smear not completed today. Last Pap smear was 08/13/2013 at [facility] and normal. Per patient has no history of an abnormal Pap smear. Reminded patient that her next Pap smear is due the end of January 2018. Last three Pap smear results are in EPIC.  Physical exam: Breasts Breasts symmetrical. No skin abnormalities bilateral breasts. No nipple retraction bilateral breasts. No nipple discharge bilateral breasts. No lymphadenopathy. No lumps palpated bilateral breasts. Complaints of left outer breast tenderness on exam. Referred patient to the [facility] for diagnostic mammogram and possible left breast ultrasound. Appointment scheduled for Thursday, May 31, 2016 at 1010.        Pelvic/Bimanual No Pap smear completed today since last Pap smear was 08/13/2013. Pap smear not indicated per [facility] guidelines.   Smoking History: Patient has never smoked.  Patient Navigation: Patient education provided. Access to services provided for patient through [facility] program.   Colorectal Cancer Screening: Per patient had a colonoscopy completed about 2 years ago. No complaints today.

## 2016-06-01 ENCOUNTER — Encounter (HOSPITAL_COMMUNITY): Payer: Self-pay | Admitting: *Deleted

## 2016-06-06 ENCOUNTER — Ambulatory Visit: Payer: Self-pay | Admitting: Internal Medicine

## 2016-08-01 IMAGING — CT CT ABD-PELV W/ CM
2 of 5 series · 17 of 46 positions shown, 19 images · IV contrast (Omnipaque 300)
Comparison: None.

CLINICAL DATA: Bilateral lower quadrant pain

EXAM:
CT ABDOMEN AND PELVIS WITH CONTRAST
TECHNIQUE: Multidetector CT imaging of the abdomen and pelvis was performed
using the standard protocol following bolus administration of
intravenous contrast.
CONTRAST:  100mL OMNIPAQUE IOHEXOL 300 MG/ML  SOLN

[Series 2: abd/ pel 5mm · axial · 0.65mm/px · z∈[-416,-26]mm · 14 of 88 slices shown, 16 images]
[im 5/88  soft-tissue]
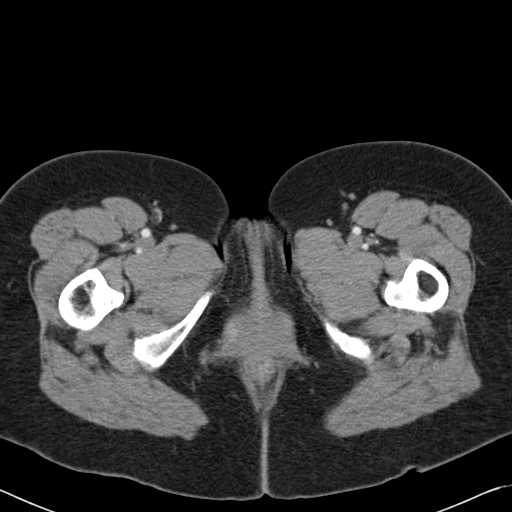
[im 5/88  bone]
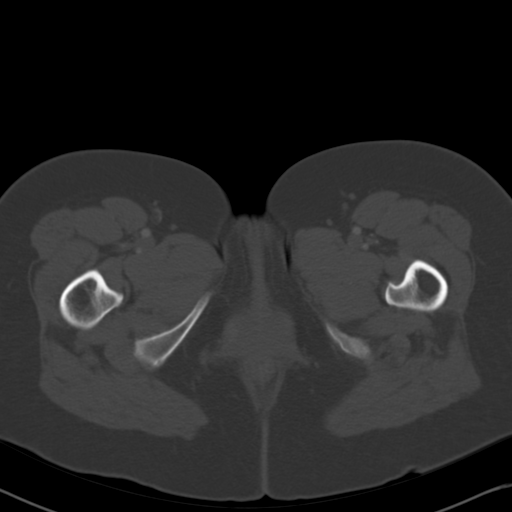
[im 13/88  soft-tissue]
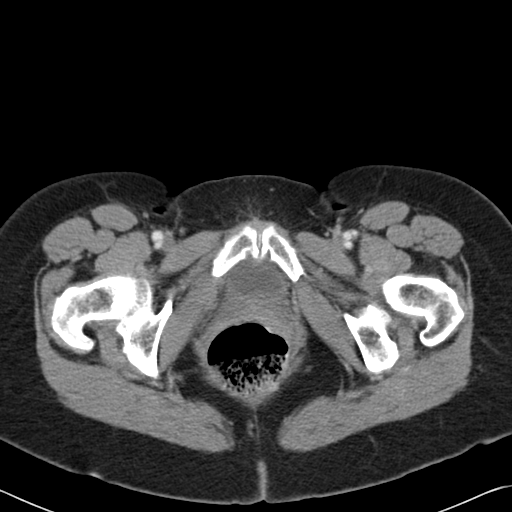
[im 17/88  soft-tissue]
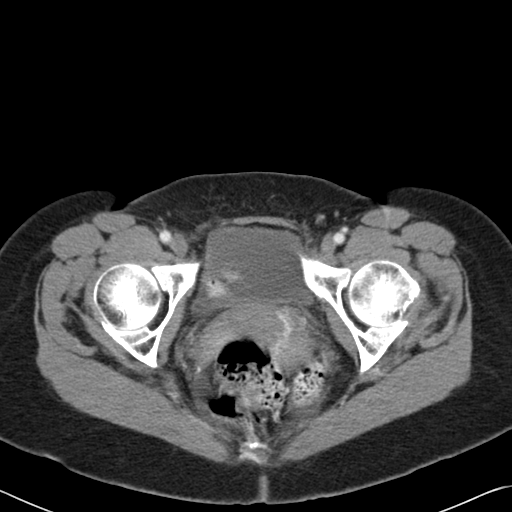
[im 25/88  soft-tissue]
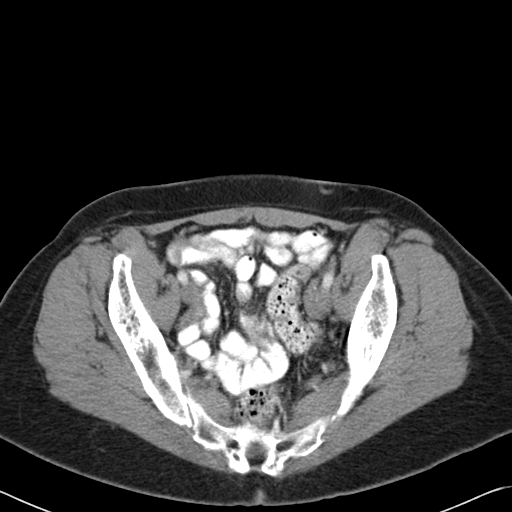
[im 30/88  soft-tissue]
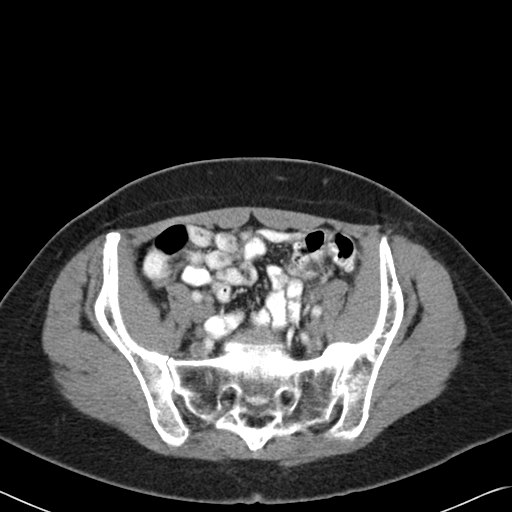
[im 34/88  soft-tissue]
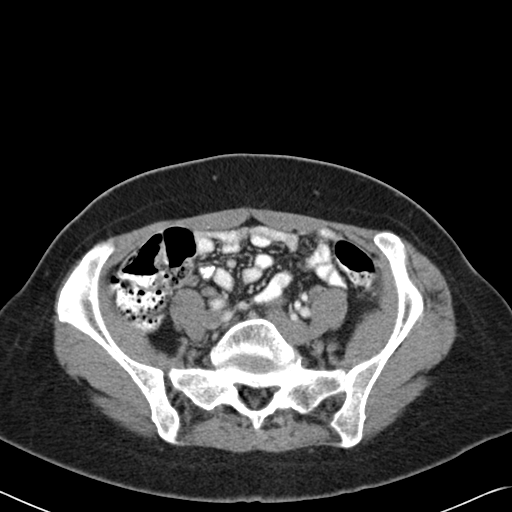
[im 42/88  soft-tissue]
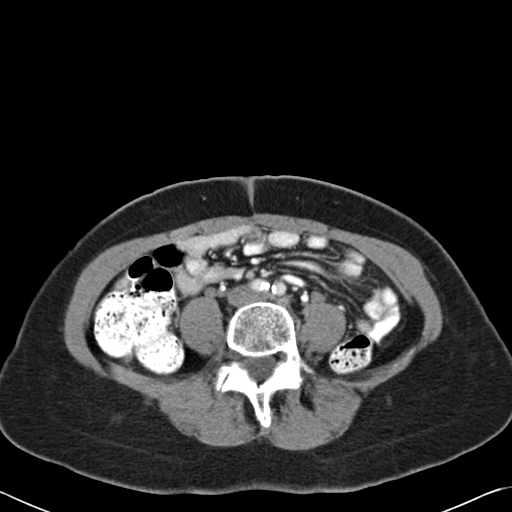
[im 46/88  soft-tissue]
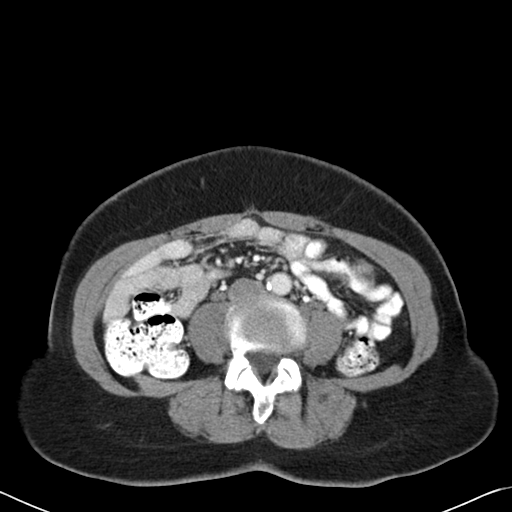
[im 54/88  soft-tissue]
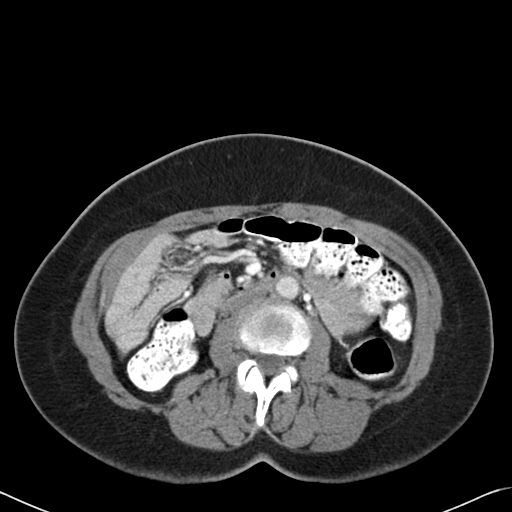
[im 54/88  bone]
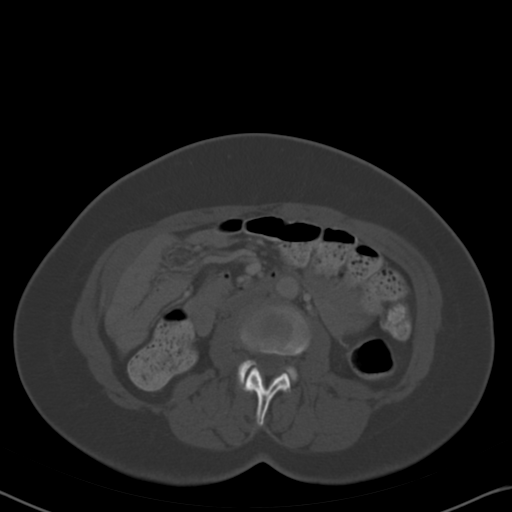
[im 59/88  soft-tissue]
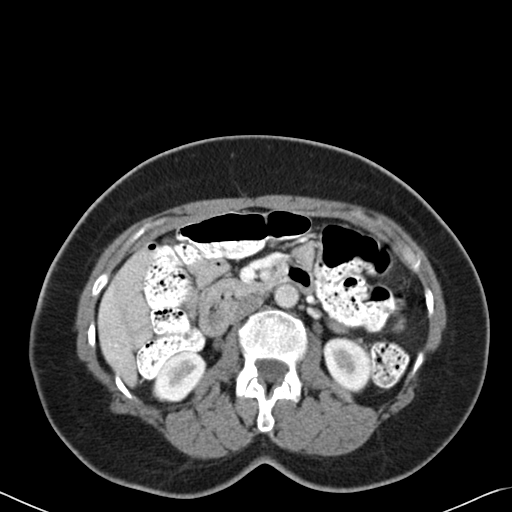
[im 67/88  soft-tissue]
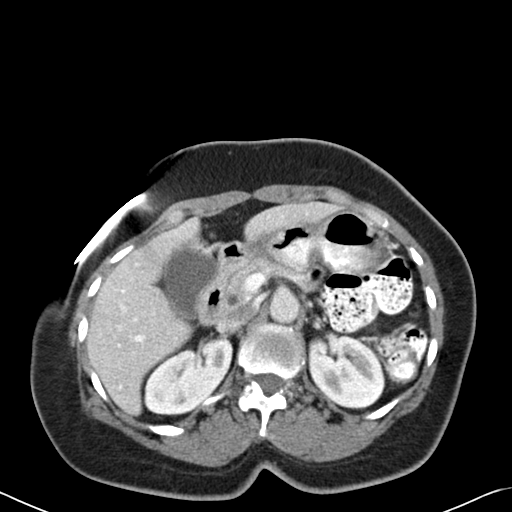
[im 71/88  soft-tissue]
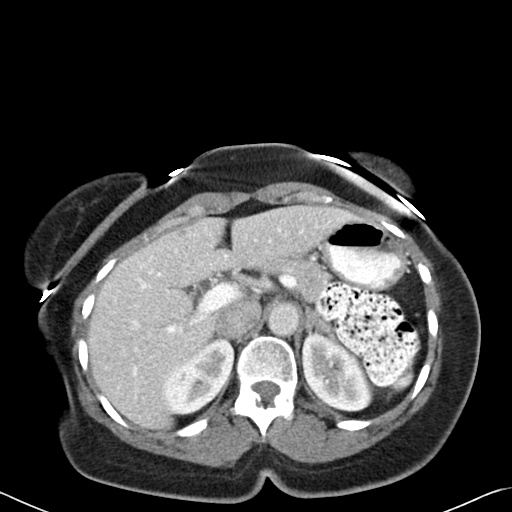
[im 75/88  soft-tissue]
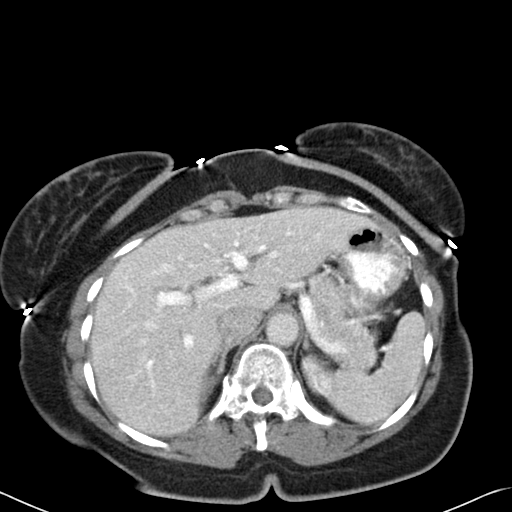
[im 83/88  soft-tissue]
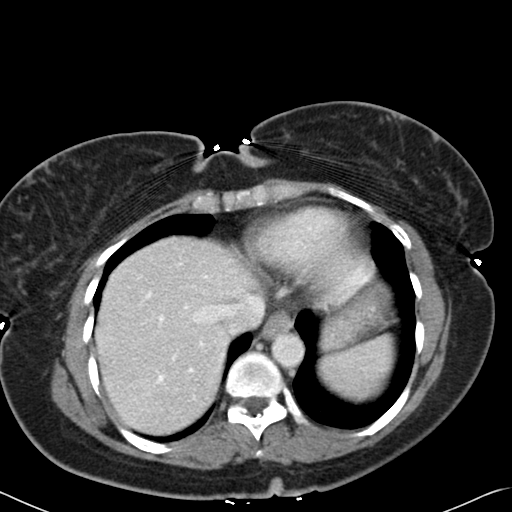

[Series 602: cor · coronal · 0.89mm/px · 3 of 99 slices shown]
[im 33/99  soft-tissue]
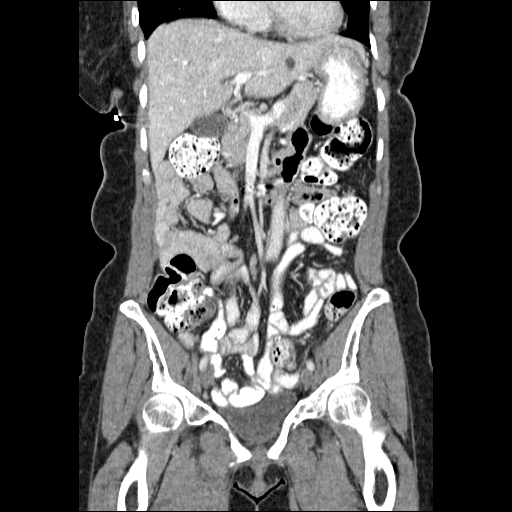
[im 44/99  soft-tissue]
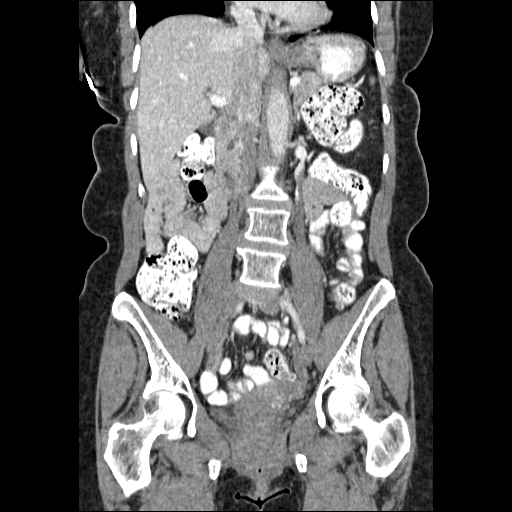
[im 55/99  soft-tissue]
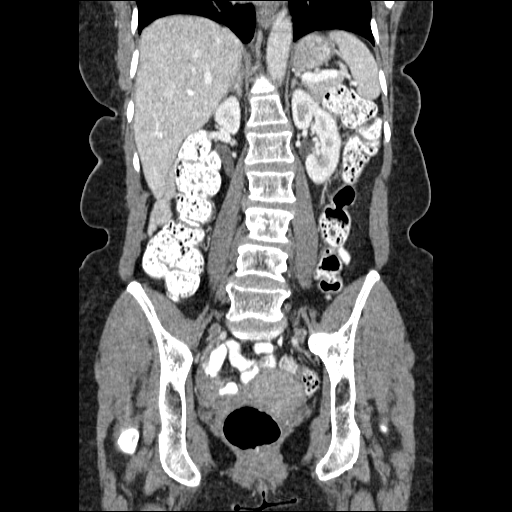

[17 of 46 positions shown; findings below may reference images not displayed]

FINDINGS: No pleural or pericardial effusion. Several simple appearing cysts
are noted within the left hepatic lobe. There is a small hypodensity
within the medial segment of left hepatic lobe measuring 4 mm. This
is too small to characterize. Gallbladder appears normal. No biliary
dilatation. Normal appearance of the pancreas. The spleen is normal.

The adrenal glands are both within normal limits. Normal appearance
of the right kidney. The left kidney is also within normal limits.
The urinary bladder appears normal. The uterus and adnexal
structures are on unremarkable.

There is no free fluid or fluid collections within the abdomen or
pelvis. Normal caliber of the abdominal aorta. There is mild
calcified atherosclerotic change noted. No retroperitoneal or small
bowel mesenteric adenopathy. No pelvic or inguinal adenopathy. The
stomach appears normal. The small bowel loops have a normal course
and caliber. No bowel obstruction. Normal appearance of the colon.

Review of the visualized osseous structures is significant for mild
scoliosis which is convex towards the left.
IMPRESSION: 1. No acute findings within the abdomen or pelvis.
2. Low attenuation structures within the liver, likely cysts.

## 2016-08-16 ENCOUNTER — Ambulatory Visit (HOSPITAL_COMMUNITY): Payer: No Typology Code available for payment source

## 2018-12-29 ENCOUNTER — Encounter: Payer: Self-pay | Admitting: Internal Medicine

## 2019-03-17 ENCOUNTER — Other Ambulatory Visit: Payer: Self-pay | Admitting: Emergency Medicine

## 2019-03-17 DIAGNOSIS — Z20822 Contact with and (suspected) exposure to covid-19: Secondary | ICD-10-CM

## 2019-03-19 LAB — NOVEL CORONAVIRUS, NAA: SARS-CoV-2, NAA: NOT DETECTED
